# Patient Record
Sex: Female | Born: 1955 | Hispanic: Yes | Marital: Single | Smoking: Never smoker
Health system: Southern US, Community
[De-identification: ages and names within clinical notes are randomized; demographics above are authoritative.]

## PROBLEM LIST (undated history)

## (undated) DIAGNOSIS — E785 Hyperlipidemia, unspecified: Secondary | ICD-10-CM

## (undated) DIAGNOSIS — H8110 Benign paroxysmal vertigo, unspecified ear: Secondary | ICD-10-CM

## (undated) DIAGNOSIS — R7303 Prediabetes: Secondary | ICD-10-CM

## (undated) DIAGNOSIS — I10 Essential (primary) hypertension: Secondary | ICD-10-CM

## (undated) HISTORY — PX: HYSTEROTOMY: SHX1776

## (undated) HISTORY — DX: Prediabetes: R73.03

## (undated) HISTORY — DX: Benign paroxysmal vertigo, unspecified ear: H81.10

## (undated) HISTORY — DX: Hyperlipidemia, unspecified: E78.5

---

## 2021-07-14 ENCOUNTER — Encounter (HOSPITAL_COMMUNITY): Payer: Self-pay

## 2021-07-14 ENCOUNTER — Other Ambulatory Visit: Payer: Self-pay

## 2021-07-14 ENCOUNTER — Emergency Department (HOSPITAL_COMMUNITY)
Admission: EM | Admit: 2021-07-14 | Discharge: 2021-07-15 | Disposition: A | Discharge status: Home / Self Care | Payer: Self-pay | Attending: Emergency Medicine | Admitting: Emergency Medicine

## 2021-07-14 DIAGNOSIS — X58XXXA Exposure to other specified factors, initial encounter: Secondary | ICD-10-CM | POA: Insufficient documentation

## 2021-07-14 DIAGNOSIS — T6591XA Toxic effect of unspecified substance, accidental (unintentional), initial encounter: Secondary | ICD-10-CM

## 2021-07-14 DIAGNOSIS — I10 Essential (primary) hypertension: Secondary | ICD-10-CM | POA: Insufficient documentation

## 2021-07-14 DIAGNOSIS — T40711A Poisoning by cannabis, accidental (unintentional), initial encounter: Secondary | ICD-10-CM | POA: Insufficient documentation

## 2021-07-14 HISTORY — DX: Essential (primary) hypertension: I10

## 2021-07-14 NOTE — ED Notes (Signed)
Poison control recommends monitoring pt for 4 hrs minimum.

## 2021-07-14 NOTE — ED Provider Notes (Signed)
[facility]-EMERGENCY DEPT Provider Note   CSN: [id number] Arrival date & time: 07/14/21  2044     History Chief Complaint  Patient presents with   Ingestion    Alexis Sloan is a 65 y.o. female with a history of high cholesterol and low blood pressure presents to the emergency department after ingestion of 3 delta 8 Gummies at 35 mg each approximately 1 hour prior to arrival.  Patient's son brings her in stating that his mother, the patient and his daughter thought that the Gummies were candy.  Patient denies headache, nausea, lightheadedness, sleepiness, vomiting, diarrhea.  No specific aggravating or alleviating factors.  The history is provided by the patient and medical records. The history is limited by a language barrier. A language interpreter was used.      Past Medical History:  Diagnosis Date   Hypertension     There are no problems to display for this patient.   History reviewed. No pertinent surgical history.   OB History   No obstetric history on file.     History reviewed. No pertinent family history.     Home Medications Prior to Admission medications   Not on File    Allergies    Patient has no known allergies.  Review of Systems   Review of Systems  Constitutional:  Negative for appetite change, diaphoresis, fatigue, fever and unexpected weight change.  HENT:  Negative for mouth sores.   Eyes:  Negative for visual disturbance.  Respiratory:  Negative for cough, chest tightness, shortness of breath and wheezing.   Cardiovascular:  Negative for chest pain.  Gastrointestinal:  Negative for abdominal pain, constipation, diarrhea, nausea and vomiting.  Endocrine: Negative for polydipsia, polyphagia and polyuria.  Genitourinary:  Negative for dysuria, frequency, hematuria and urgency.  Musculoskeletal:  Negative for back pain and neck stiffness.  Skin:  Negative for rash.  Allergic/Immunologic: Negative for  immunocompromised state.  Neurological:  Negative for syncope, light-headedness and headaches.  Hematological:  Does not bruise/bleed easily.  Psychiatric/Behavioral:  Negative for sleep disturbance. The patient is not nervous/anxious.    Physical Exam Updated Vital Signs BP (!) 161/81 (BP Location: Right Arm)   Pulse 76   Temp 98.7 F (37.1 C) (Oral)   Resp 15   Ht 5' (1.524 m)   Wt 73.5 kg   SpO2 94%   BMI 31.64 kg/m   Physical Exam Vitals and nursing note reviewed.  Constitutional:      General: She is not in acute distress.    Appearance: She is not diaphoretic.  HENT:     Head: Normocephalic.  Eyes:     General: No scleral icterus.    Conjunctiva/sclera: Conjunctivae normal.  Cardiovascular:     Rate and Rhythm: Normal rate and regular rhythm.     Pulses: Normal pulses.          Radial pulses are 2+ on the right side and 2+ on the left side.  Pulmonary:     Effort: No tachypnea, accessory muscle usage, prolonged expiration, respiratory distress or retractions.     Breath sounds: No stridor.     Comments: Equal chest rise. No increased work of breathing. Abdominal:     General: There is no distension.     Palpations: Abdomen is soft.     Tenderness: There is no abdominal tenderness. There is no guarding or rebound.  Musculoskeletal:     Cervical back: Normal range of motion.     Comments: Moves  all extremities equally and without difficulty.  Skin:    General: Skin is warm and dry.     Capillary Refill: Capillary refill takes less than 2 seconds.  Neurological:     Mental Status: She is alert.     GCS: GCS eye subscore is 4. GCS verbal subscore is 5. GCS motor subscore is 6.     Comments: Speech is clear and goal oriented.  Psychiatric:        Mood and Affect: Mood normal.    ED Results / Procedures / Treatments     Procedures Procedures   Medications Ordered in ED Medications - No data to display  ED Course  I have reviewed the triage vital signs  and the nursing notes.  Pertinent labs & imaging results that were available during my care of the patient were reviewed by me and considered in my medical decision making (see chart for details).    MDM Rules/Calculators/A&P                          Patient presents with accidental overdose of delta 8 Gummies.  Triage RN discussed with poison control who recommends 4 hours of observation with vital sign monitoring.  Does not recommend blood work or telemetry at this time.  Vitals:   07/14/21 2101 07/15/21 0012  BP: (!) 161/81 123/72  Pulse: 76 67  Temp: 98.7 F (37.1 C)   Resp: 15 16  Height: 5' (1.524 m)   Weight: 73.5 kg   SpO2: 94% 98%  TempSrc: Oral   BMI (Calculated): 31.64      12:06 AM Patient reports she continues to feel well.  Does not wish to stay any longer for evaluation as she remains asymptomatic.  Vital signs within normal limits.  Patient will be discharged.  Discussed reasons to return immediately to the emergency department.   Final Clinical Impression(s) / ED Diagnoses Final diagnoses:  Accidental ingestion of substance, initial encounter    Rx / DC Orders ED Discharge Orders     None        Krishiv Sandler, Boyd Kerbs 07/15/21 0023    Margarita Grizzle, MD 07/16/21 [phone]

## 2021-07-14 NOTE — ED Triage Notes (Signed)
Pt reports taking 3 35 mg delta 9 gummies. Pt denies any symptoms at this time. Son is worried and brought her in to be evaluated.

## 2021-07-15 ENCOUNTER — Encounter (HOSPITAL_COMMUNITY): Payer: Self-pay | Admitting: Emergency Medicine

## 2021-07-15 ENCOUNTER — Inpatient Hospital Stay (HOSPITAL_COMMUNITY): Payer: Medicaid Other

## 2021-07-15 ENCOUNTER — Emergency Department (HOSPITAL_COMMUNITY): Payer: Medicaid Other

## 2021-07-15 ENCOUNTER — Inpatient Hospital Stay (HOSPITAL_COMMUNITY)
Admission: EM | Admit: 2021-07-15 | Discharge: 2021-07-18 | DRG: 917 | Disposition: A | Discharge status: Home / Self Care | Payer: Medicaid Other | Attending: Internal Medicine | Admitting: Internal Medicine

## 2021-07-15 DIAGNOSIS — D72829 Elevated white blood cell count, unspecified: Secondary | ICD-10-CM | POA: Diagnosis present

## 2021-07-15 DIAGNOSIS — R739 Hyperglycemia, unspecified: Secondary | ICD-10-CM | POA: Diagnosis present

## 2021-07-15 DIAGNOSIS — T50904A Poisoning by unspecified drugs, medicaments and biological substances, undetermined, initial encounter: Secondary | ICD-10-CM | POA: Diagnosis present

## 2021-07-15 DIAGNOSIS — T40711A Poisoning by cannabis, accidental (unintentional), initial encounter: Secondary | ICD-10-CM | POA: Diagnosis present

## 2021-07-15 DIAGNOSIS — I10 Essential (primary) hypertension: Secondary | ICD-10-CM | POA: Diagnosis present

## 2021-07-15 DIAGNOSIS — G929 Unspecified toxic encephalopathy: Secondary | ICD-10-CM | POA: Diagnosis present

## 2021-07-15 DIAGNOSIS — J9601 Acute respiratory failure with hypoxia: Secondary | ICD-10-CM | POA: Diagnosis present

## 2021-07-15 DIAGNOSIS — E872 Acidosis: Secondary | ICD-10-CM | POA: Diagnosis present

## 2021-07-15 DIAGNOSIS — R680 Hypothermia, not associated with low environmental temperature: Secondary | ICD-10-CM | POA: Diagnosis present

## 2021-07-15 DIAGNOSIS — R001 Bradycardia, unspecified: Secondary | ICD-10-CM | POA: Diagnosis present

## 2021-07-15 DIAGNOSIS — Z6832 Body mass index (BMI) 32.0-32.9, adult: Secondary | ICD-10-CM | POA: Diagnosis not present

## 2021-07-15 DIAGNOSIS — G928 Other toxic encephalopathy: Secondary | ICD-10-CM | POA: Diagnosis present

## 2021-07-15 DIAGNOSIS — T50901A Poisoning by unspecified drugs, medicaments and biological substances, accidental (unintentional), initial encounter: Secondary | ICD-10-CM

## 2021-07-15 DIAGNOSIS — Z20822 Contact with and (suspected) exposure to covid-19: Secondary | ICD-10-CM | POA: Diagnosis present

## 2021-07-15 DIAGNOSIS — Z978 Presence of other specified devices: Secondary | ICD-10-CM | POA: Diagnosis present

## 2021-07-15 DIAGNOSIS — T68XXXA Hypothermia, initial encounter: Secondary | ICD-10-CM

## 2021-07-15 LAB — COMPREHENSIVE METABOLIC PANEL
ALT: 29 U/L (ref 0–44)
AST: 24 U/L (ref 15–41)
Albumin: 4.2 g/dL (ref 3.5–5.0)
Alkaline Phosphatase: 54 U/L (ref 38–126)
Anion gap: 13 (ref 5–15)
BUN: 16 mg/dL (ref 8–23)
CO2: 19 mmol/L — ABNORMAL LOW (ref 22–32)
Calcium: 9.1 mg/dL (ref 8.9–10.3)
Chloride: 106 mmol/L (ref 98–111)
Creatinine, Ser: 0.7 mg/dL (ref 0.44–1.00)
GFR, Estimated: >60 mL/min (ref >60)
Glucose, Bld: 196 mg/dL — ABNORMAL HIGH (ref 70–99)
Potassium: 3.9 mmol/L (ref 3.5–5.1)
Sodium: 138 mmol/L (ref 135–145)
Total Bilirubin: 0.5 mg/dL (ref 0.3–1.2)
Total Protein: 7.7 g/dL (ref 6.5–8.1)

## 2021-07-15 LAB — URINALYSIS, ROUTINE W REFLEX MICROSCOPIC
Bilirubin Urine: NEGATIVE
Glucose, UA: NEGATIVE mg/dL
Hgb urine dipstick: NEGATIVE
Ketones, ur: NEGATIVE mg/dL
Leukocytes,Ua: NEGATIVE
Nitrite: NEGATIVE
Protein, ur: NEGATIVE mg/dL
Specific Gravity, Urine: 1.023 (ref 1.005–1.030)
pH: 5 (ref 5.0–8.0)

## 2021-07-15 LAB — BLOOD GAS, ARTERIAL
Acid-base deficit: 3.8 mmol/L — ABNORMAL HIGH (ref 0.0–2.0)
Bicarbonate: 20.8 mmol/L (ref 20.0–28.0)
FIO2: 100
MECHVT: 370 mL
O2 Saturation: 100 %
PEEP: 5 cmH2O
Patient temperature: 95.9
RATE: 16 resp/min
pCO2 arterial: 35.8 mmHg (ref 32.0–48.0)
pH, Arterial: 7.374 (ref 7.350–7.450)
pO2, Arterial: 375 mmHg — ABNORMAL HIGH (ref 83.0–108.0)

## 2021-07-15 LAB — LACTIC ACID, PLASMA
Lactic Acid, Venous: 2.6 mmol/L (ref 0.5–1.9)
Lactic Acid, Venous: 2.7 mmol/L (ref 0.5–1.9)

## 2021-07-15 LAB — GLUCOSE, CAPILLARY
Glucose-Capillary: 116 mg/dL — ABNORMAL HIGH (ref 70–99)
Glucose-Capillary: 107 mg/dL — ABNORMAL HIGH (ref 70–99)
Glucose-Capillary: 111 mg/dL — ABNORMAL HIGH (ref 70–99)

## 2021-07-15 LAB — TSH: TSH: 0.819 u[IU]/mL (ref 0.350–4.500)

## 2021-07-15 LAB — CBC WITH DIFFERENTIAL/PLATELET
Abs Immature Granulocytes: 0.05 10*3/uL (ref 0.00–0.07)
Basophils Absolute: 0 10*3/uL (ref 0.0–0.1)
Basophils Relative: 0 %
Eosinophils Absolute: 0.3 10*3/uL (ref 0.0–0.5)
Eosinophils Relative: 2 %
HCT: 41.9 % (ref 36.0–46.0)
Hemoglobin: 13.8 g/dL (ref 12.0–15.0)
Immature Granulocytes: 0 %
Lymphocytes Relative: 34 %
Lymphs Abs: 4.1 10*3/uL — ABNORMAL HIGH (ref 0.7–4.0)
MCH: 30.7 pg (ref 26.0–34.0)
MCHC: 32.9 g/dL (ref 30.0–36.0)
MCV: 93.3 fL (ref 80.0–100.0)
Monocytes Absolute: 0.6 10*3/uL (ref 0.1–1.0)
Monocytes Relative: 5 %
Neutro Abs: 7.1 10*3/uL (ref 1.7–7.7)
Neutrophils Relative %: 59 %
Platelets: 241 10*3/uL (ref 150–400)
RBC: 4.49 MIL/uL (ref 3.87–5.11)
RDW: 12.9 % (ref 11.5–15.5)
WBC: 12.1 10*3/uL — ABNORMAL HIGH (ref 4.0–10.5)
nRBC: 0 % (ref 0.0–0.2)

## 2021-07-15 LAB — SALICYLATE LEVEL: Salicylate Lvl: <7 mg/dL — ABNORMAL LOW (ref 7.0–30.0)

## 2021-07-15 LAB — MRSA NEXT GEN BY PCR, NASAL: MRSA by PCR Next Gen: NOT DETECTED

## 2021-07-15 LAB — SARS CORONAVIRUS 2 (TAT 6-24 HRS): SARS Coronavirus 2: NEGATIVE

## 2021-07-15 LAB — TROPONIN I (HIGH SENSITIVITY)
Troponin I (High Sensitivity): <2 ng/L (ref <18)
Troponin I (High Sensitivity): <2 ng/L (ref <18)

## 2021-07-15 LAB — RAPID URINE DRUG SCREEN, HOSP PERFORMED
Amphetamines: NOT DETECTED
Barbiturates: NOT DETECTED
Benzodiazepines: NOT DETECTED
Cocaine: NOT DETECTED
Opiates: NOT DETECTED
Tetrahydrocannabinol: POSITIVE — AB

## 2021-07-15 LAB — CK: Total CK: 115 U/L (ref 38–234)

## 2021-07-15 LAB — ACETAMINOPHEN LEVEL: Acetaminophen (Tylenol), Serum: <10 ug/mL — ABNORMAL LOW (ref 10–30)

## 2021-07-15 LAB — HIV ANTIBODY (ROUTINE TESTING W REFLEX): HIV Screen 4th Generation wRfx: NONREACTIVE

## 2021-07-15 LAB — ETHANOL: Alcohol, Ethyl (B): <10 mg/dL (ref <10)

## 2021-07-15 LAB — AMMONIA: Ammonia: 26 umol/L (ref 9–35)

## 2021-07-15 MED ORDER — SODIUM CHLORIDE 0.9 % IV SOLN
INTRAVENOUS | Status: DC | PRN
  Administered 2021-07-15: 250 mL via INTRAVENOUS

## 2021-07-15 MED ORDER — PROPOFOL 1000 MG/100ML IV EMUL
0.0000 ug/kg/min | INTRAVENOUS | Status: DC
Start: 2021-07-15 — End: 2021-07-15
  Filled 2021-07-15: qty 100

## 2021-07-15 MED ORDER — FENTANYL CITRATE (PF) 100 MCG/2ML IJ SOLN
50.0000 ug | INTRAMUSCULAR | Status: DC | PRN
  Filled 2021-07-15: qty 2

## 2021-07-15 MED ORDER — MIDAZOLAM HCL 2 MG/2ML IJ SOLN
INTRAMUSCULAR | Status: AC
  Filled 2021-07-15: qty 6

## 2021-07-15 MED ORDER — DEXMEDETOMIDINE HCL IN NACL 200 MCG/50ML IV SOLN
0.4000 ug/kg/h | INTRAVENOUS | Status: DC
  Filled 2021-07-15: qty 50

## 2021-07-15 MED ORDER — FENTANYL CITRATE (PF) 100 MCG/2ML IJ SOLN: 25.0000 ug | INTRAMUSCULAR | Status: DC | PRN

## 2021-07-15 MED ORDER — FAMOTIDINE 20 MG PO TABS
20.0000 mg | ORAL_TABLET | Freq: Two times a day (BID) | ORAL | Status: DC
  Administered 2021-07-16: 20 mg via ORAL
  Filled 2021-07-15: qty 1

## 2021-07-15 MED ORDER — MIDAZOLAM 50MG/50ML (1MG/ML) PREMIX INFUSION
0.5000 mg/h | INTRAVENOUS | Status: DC
  Administered 2021-07-15: 0.5 mg/h via INTRAVENOUS
  Filled 2021-07-15: qty 50

## 2021-07-15 MED ORDER — SUCCINYLCHOLINE CHLORIDE 20 MG/ML IJ SOLN
INTRAMUSCULAR | Status: AC | PRN
  Administered 2021-07-15: 80 mg via INTRAVENOUS

## 2021-07-15 MED ORDER — POTASSIUM CHLORIDE IN NACL 20-0.9 MEQ/L-% IV SOLN
INTRAVENOUS | Status: DC
  Filled 2021-07-15 (×2): qty 1000

## 2021-07-15 MED ORDER — MIDAZOLAM HCL 5 MG/5ML IJ SOLN
INTRAMUSCULAR | Status: AC | PRN
  Administered 2021-07-15: 5 mg via INTRAVENOUS

## 2021-07-15 MED ORDER — FENTANYL CITRATE (PF) 100 MCG/2ML IJ SOLN
INTRAMUSCULAR | Status: AC | PRN
  Administered 2021-07-15: 50 ug via INTRAVENOUS

## 2021-07-15 MED ORDER — PROPOFOL 1000 MG/100ML IV EMUL: 0.0000 ug/kg/min | INTRAVENOUS | Status: DC

## 2021-07-15 MED ORDER — POLYETHYLENE GLYCOL 3350 17 G PO PACK
17.0000 g | PACK | Freq: Every day | ORAL | Status: DC
  Administered 2021-07-15: 17 g
  Filled 2021-07-15: qty 1

## 2021-07-15 MED ORDER — ALBUTEROL SULFATE (2.5 MG/3ML) 0.083% IN NEBU: 2.5000 mg | INHALATION_SOLUTION | RESPIRATORY_TRACT | Status: DC | PRN

## 2021-07-15 MED ORDER — NALOXONE HCL 2 MG/2ML IJ SOSY
PREFILLED_SYRINGE | INTRAMUSCULAR | Status: AC
  Administered 2021-07-15: 2 mg
  Filled 2021-07-15: qty 2

## 2021-07-15 MED ORDER — ETOMIDATE 2 MG/ML IV SOLN
INTRAVENOUS | Status: AC | PRN
  Administered 2021-07-15: 20 mg via INTRAVENOUS

## 2021-07-15 MED ORDER — PROPOFOL 500 MG/50ML IV EMUL
INTRAVENOUS | Status: AC
  Filled 2021-07-15: qty 50

## 2021-07-15 MED ORDER — HEPARIN SODIUM (PORCINE) 5000 UNIT/ML IJ SOLN
5000.0000 [IU] | Freq: Three times a day (TID) | INTRAMUSCULAR | Status: DC
  Administered 2021-07-15 – 2021-07-18 (×9): 5000 [IU] via SUBCUTANEOUS
  Filled 2021-07-15 (×9): qty 1

## 2021-07-15 MED ORDER — DOCUSATE SODIUM 50 MG/5ML PO LIQD
100.0000 mg | Freq: Two times a day (BID) | ORAL | Status: DC
  Administered 2021-07-15: 100 mg
  Filled 2021-07-15: qty 10

## 2021-07-15 MED ORDER — ETOMIDATE 2 MG/ML IV SOLN
INTRAVENOUS | Status: AC
  Filled 2021-07-15: qty 20

## 2021-07-15 MED ORDER — MIDAZOLAM HCL 2 MG/2ML IJ SOLN
5.0000 mg | Freq: Once | INTRAMUSCULAR | Status: DC
  Filled 2021-07-15: qty 6

## 2021-07-15 MED ORDER — ACETAMINOPHEN 650 MG RE SUPP: 650.0000 mg | RECTAL | Status: DC | PRN

## 2021-07-15 MED ORDER — PROPOFOL 1000 MG/100ML IV EMUL
INTRAVENOUS | Status: AC | PRN
  Administered 2021-07-15: 5 ug/kg/min via INTRAVENOUS

## 2021-07-15 MED ORDER — FAMOTIDINE IN NACL 20-0.9 MG/50ML-% IV SOLN
20.0000 mg | Freq: Two times a day (BID) | INTRAVENOUS | Status: DC
  Administered 2021-07-15 (×2): 20 mg via INTRAVENOUS
  Filled 2021-07-15 (×2): qty 50

## 2021-07-15 MED ORDER — FENTANYL CITRATE (PF) 100 MCG/2ML IJ SOLN
50.0000 ug | INTRAMUSCULAR | Status: DC | PRN
Start: 2021-07-15 — End: 2021-07-15
  Filled 2021-07-15: qty 2

## 2021-07-15 MED ORDER — PROPOFOL 1000 MG/100ML IV EMUL
INTRAVENOUS | Status: AC
  Filled 2021-07-15: qty 100

## 2021-07-15 MED ORDER — ORAL CARE MOUTH RINSE
15.0000 mL | OROMUCOSAL | Status: DC
  Administered 2021-07-15: 15 mL via OROMUCOSAL

## 2021-07-15 MED ORDER — POLYETHYLENE GLYCOL 3350 17 G PO PACK: 17.0000 g | PACK | Freq: Every day | ORAL | Status: DC | PRN

## 2021-07-15 MED ORDER — ACETAMINOPHEN 325 MG PO TABS
650.0000 mg | ORAL_TABLET | ORAL | Status: DC | PRN
  Administered 2021-07-16 – 2021-07-17 (×2): 650 mg via ORAL
  Filled 2021-07-15 (×2): qty 2

## 2021-07-15 MED ORDER — SODIUM CHLORIDE 0.9 % IV BOLUS
1000.0000 mL | Freq: Once | INTRAVENOUS | Status: AC
  Administered 2021-07-15: 1000 mL via INTRAVENOUS

## 2021-07-15 MED ORDER — INSULIN ASPART 100 UNIT/ML IJ SOLN: 0.0000 [IU] | INTRAMUSCULAR | Status: DC

## 2021-07-15 MED ORDER — CHLORHEXIDINE GLUCONATE 0.12% ORAL RINSE (MEDLINE KIT)
15.0000 mL | Freq: Two times a day (BID) | OROMUCOSAL | Status: DC
  Administered 2021-07-15: 15 mL via OROMUCOSAL

## 2021-07-15 MED ORDER — DOCUSATE SODIUM 100 MG PO CAPS: 100.0000 mg | ORAL_CAPSULE | Freq: Two times a day (BID) | ORAL | Status: DC | PRN

## 2021-07-15 MED ORDER — SUCCINYLCHOLINE CHLORIDE 200 MG/10ML IV SOSY
PREFILLED_SYRINGE | INTRAVENOUS | Status: AC
  Filled 2021-07-15: qty 10

## 2021-07-15 MED ORDER — ORAL CARE MOUTH RINSE
15.0000 mL | Freq: Two times a day (BID) | OROMUCOSAL | Status: DC
  Administered 2021-07-15: 15 mL via OROMUCOSAL

## 2021-07-15 MED ORDER — CHLORHEXIDINE GLUCONATE CLOTH 2 % EX PADS
6.0000 | MEDICATED_PAD | Freq: Every day | CUTANEOUS | Status: DC
Start: 2021-07-15 — End: 2021-07-17
  Administered 2021-07-15 – 2021-07-16 (×2): 6 via TOPICAL

## 2021-07-15 NOTE — Procedures (Signed)
Extubation Procedure Note  Patient Details:   Name: Alexis Sloan DOB: 1956-05-15 MRN: [id number]   Airway Documentation:  Airway 7 mm (Active)  Secured at (cm) 23 cm 07/15/21 1238  Measured From Lips 07/15/21 1238  Secured Location Center 07/15/21 0430  Secured By [employer] 07/15/21 1048  Tube Holder Repositioned Yes 07/15/21 1048  Prone position No 07/15/21 1048  Cuff Pressure (cm H2O) MOV (Manual Technique) 07/15/21 0743  Site Condition Dry 07/15/21 0730   Vent end date: (not recorded) Vent end time: (not recorded)   Evaluation  O2 sats: stable throughout Complications: No apparent complications Patient did tolerate procedure well. Bilateral Breath Sounds: Clear, Diminished   Yes  Dairl Ponder Nannette 07/15/2021, 3:37 PM  Positive cuff leak pre extubation. Placed on 2 LPM nasal cannula post extubation- Sp02 100%, no distress at this time. RN aware PT has been extubated.

## 2021-07-15 NOTE — Plan of Care (Signed)
Discussed in front of patient plan of care, admission needs and sedation management with no evidence of learning.  Patient is intubated and on a ventilator.  Will have to verify admission question with family.  Problem: Safety: Goal: Non-violent Restraint(s) Outcome: Progressing

## 2021-07-15 NOTE — H&P (Signed)
NAME:  Alexis Sloan MRN:  [id number] DOB:  03-30-1956 LOS: 0 ADMISSION DATE:  07/15/2021 DATE OF SERVICE:  07/15/2021  CHIEF COMPLAINT:  altered mental status   HISTORY & PHYSICAL  History of Present Illness  This 65 y.o. Latina female presented to the [facility] Emergency Department via EMS with complaints of altered mental status.  She had just been discharged from the same facility approximately one hour earlier after observation for self-reported overdose on THC-containing gummies.  She reported that she ate the gummies without knowing they contained THC.  She reportedly walked out of the ER on her own; however, EMS was soon after called to her house, where she was found unresponsive.  She was transported to the ER, where she was intubated for airway protection.  She is on mechanical ventilatory support and sedated at the time of this clinical encounter.  CT of head showed no acute process.  REVIEW OF SYSTEMS This patient is critically ill and cannot provide additional history nor review of systems due to mental status/unconsciousness, endotracheally intubated, and language barrier (Spanish) .   Past Medical/Surgical/Social/Family History   Past Medical History:  Diagnosis Date   Hypertension     History reviewed. No pertinent surgical history.  Social History   Tobacco Use   Smoking status: Never    Passive exposure: Never   Smokeless tobacco: Never  Substance Use Topics   Alcohol use: Never    History reviewed. No pertinent family history.   Procedures:  7/18 intubated in ER   Significant Diagnostic Tests:  UDS positive for [facility]   Micro Data:  No results found for this or any previous visit.    Antimicrobials:      Interim history/subjective:     Objective   BP 119/73   Pulse (!) 52   Temp (!) 95.8 F (35.4 C)   Resp 16   Ht 5' (1.524 m)   Wt 73.8 kg   SpO2 100%   BMI 31.79 kg/m     Filed Weights   07/15/21  0430  Weight: 73.8 kg   No intake or output data in the 24 hours ending 07/15/21 0613  Vent Mode: PRVC FiO2 (%):  [100 %] 100 % Set Rate:  [16 bmp] 16 bmp Vt Set:  [370 mL] 370 mL PEEP:  [5 cmH20] 5 cmH20 Plateau Pressure:  [17 cmH20] 17 cmH20   Examination: GENERAL: Intubated. Seddated. Well-developed. No acute distress. HEAD: normocephalic, atraumatic EYE: PERRLA, EOM intact, no scleral icterus, no pallor. THROAT/ORAL CAVITY: Normal dentition. No exudate. Mucous membranes are moist.  NECK: supple, no thyromegaly, no JVD, no lymphadenopathy. Trachea midline. CHEST/LUNG: symmetric in development and expansion. Good air entry. No crackles. No wheezes. HEART: Regular S1 and S2 without murmur, rub or gallop. ABDOMEN: soft, nontender, nondistended. Normoactive bowel sounds. No rebound. No guarding. No hepatosplenomegaly. EXTREMITIES: Edema: none. No cyanosis. No clubbing. 2+ DP pulses LYMPHATIC: no cervical/axillary/inguinal lymph nodes appreciated MUSCULOSKELETAL: No point tenderness. No bulk atrophy. Joints: normal inspection.  SKIN:  No rash or lesion. NEUROLOGIC: No response to noxious stimuli.  Synchronous with ventilator.  Corneal reflex intact.   Resolved Hospital Problem list      Assessment & Plan:   ASSESSMENT/PLAN:  ASSESSMENT (included in the Hospital Problem List)  Principal Problem:   Overdose, drug, undetermined intent, initial encounter Active Problems:   Toxic encephalopathy   Leukocytosis   Endotracheally intubated   By systems: PULMONARY Endotracheally intubated Bibasilar atelectasis on CXR  Trach aspirate for Gram stain, C/S Titrate sedation for RASS 0 to -1 Anticipate liberation from mechanical ventilatory support within 24 hours   CARDIOVASCULAR: No acute issues Hypertension at baseline Hemodynamic monitoring per ICU protocol   RENAL: No acute issues Foley catheter placement (24 hour critically ill patient)   GASTROINTESTINAL: No acute  issues GI PROPHYLAXIS: famotidine   HEMATOLOGIC Leukocytosis, unknown etiology Trach aspirate No strong indication for antibiotics at this time DVT PROPHYLAXIS: heparin   INFECTIOUS: No acute issues Asymptomatic COVID-19 screening   ENDOCRINE: No acute issues   NEUROLOGIC Encephalopathy, toxic, likely secondary to substance abuse Titrate sedation for RASS 0 to -1   PLAN/RECOMMENDATIONS  Admit to ICU under my service (Attending: Marcelle Smiling, MD) with the diagnoses highlighted above in the active Hospital Problem List (ASSESSMENT). See above.    My assessment, plan of care, findings, medications, side effects, etc. were discussed with: nurse.   Best practice:  Diet: TF Pain/Anxiety/Delirium protocol (if indicated): propofol VAP protocol (if indicated): YES DVT prophylaxis: heparin GI prophylaxis: famotidine Glucose control: N/A Mobility/Activity: bedrest   Code Status: Full Code Family Communication:   no family at bedside Disposition: admit to ICU   Labs   CBC: Recent Labs  Lab 07/15/21 0418  WBC 12.1*  NEUTROABS 7.1  HGB 13.8  HCT 41.9  MCV 93.3  PLT 241    Basic Metabolic Panel: Recent Labs  Lab 07/15/21 0418  NA 138  K 3.9  CL 106  CO2 19*  GLUCOSE 196*  BUN 16  CREATININE 0.70  CALCIUM 9.1   GFR: Estimated Creatinine Clearance: 62.9 mL/min (by C-G formula based on SCr of 0.7 mg/dL). Recent Labs  Lab 07/15/21 0418  WBC 12.1*    Liver Function Tests: Recent Labs  Lab 07/15/21 0418  AST 24  ALT 29  ALKPHOS 54  BILITOT 0.5  PROT 7.7  ALBUMIN 4.2   No results for input(s): LIPASE, AMYLASE in the last 168 hours. No results for input(s): AMMONIA in the last 168 hours.  ABG    Component Value Date/Time   PHART 7.374 07/15/2021 0535   PCO2ART 35.8 07/15/2021 0535   PO2ART 375 (H) 07/15/2021 0535   HCO3 20.8 07/15/2021 0535   ACIDBASEDEF 3.8 (H) 07/15/2021 0535   O2SAT 100.0 07/15/2021 0535     Coagulation Profile: No  results for input(s): INR, PROTIME in the last 168 hours.  Cardiac Enzymes: Recent Labs  Lab 07/15/21 0418  CKTOTAL 115    HbA1C: No results found for: HGBA1C  CBG: No results for input(s): GLUCAP in the last 168 hours.   Past Medical History   Past Medical History:  Diagnosis Date   Hypertension       Surgical History   History reviewed. No pertinent surgical history.    Social History   Social History   Socioeconomic History   Marital status: Single    Spouse name: Not on file   Number of children: Not on file   Years of education: Not on file   Highest education level: Not on file  Occupational History   Not on file  Tobacco Use   Smoking status: Never    Passive exposure: Never   Smokeless tobacco: Never  Substance and Sexual Activity   Alcohol use: Never   Drug use: Never   Sexual activity: Not on file  Other Topics Concern   Not on file  Social History Narrative   Not on file   Social Determinants of Health  Financial Resource Strain: Not on file  Food Insecurity: Not on file  Transportation Needs: Not on file  Physical Activity: Not on file  Stress: Not on file  Social Connections: Not on file      Family History   History reviewed. No pertinent family history. family history is not on file.    Allergies No Known Allergies    Current Medications  Current Facility-Administered Medications:    fentaNYL (SUBLIMAZE) injection 50 mcg, 50 mcg, Intravenous, Q15 min PRN, Rancour, Stephen, MD   fentaNYL (SUBLIMAZE) injection 50 mcg, 50 mcg, Intravenous, Q2H PRN, Rancour, Stephen, MD   midazolam (VERSED) 50 mg/50 mL (1 mg/mL) premix infusion, 0.5-10 mg/hr, Intravenous, Continuous, Rancour, Stephen, MD, Last Rate: 0.5 mL/hr at 07/15/21 0538, 0.5 mg/hr at 07/15/21 0538   midazolam (VERSED) injection 5 mg, 5 mg, Intravenous, Once, Rancour, Stephen, MD   propofol (DIPRIVAN) 1000 MG/100ML infusion, 0-50 mcg/kg/min, Intravenous, Continuous,  Rancour, Stephen, MD No current outpatient medications on file.   Home Medications  Prior to Admission medications   Not on File      Critical care time: 30 minutes.  The treatment and management of the patient's condition was required based on the threat of imminent deterioration. This time reflects time spent by the physician evaluating, providing care and managing the critically ill patient's care. The time was spent at the immediate bedside (or on the same floor/unit and dedicated to this patient's care). Time involved in separately billable procedures is NOT included int he critical care time indicated above. Family meeting and update time may be included above if and only if the patient is unable/incompetent to participate in clinical interview and/or decision making, and the discussion was necessary to determining treatment decisions.   Marcelle Smiling, MD Board Certified by the ABIM, Pulmonary Diseases & Critical Care Medicine

## 2021-07-15 NOTE — Progress Notes (Deleted)
Nutrition Brief Note  Consult received for TF initiation and management.   Wt Readings from Last 15 Encounters:  07/15/21 73.8 kg  07/14/21 73.5 kg    Body mass index is 31.79 kg/m. Patient meets criteria for obesity based on current BMI. PTA the only other weight recorded in the chart was 159 lb at [facility] the [place] on 02/13/20. Weight today is 162 lb. Skin WDL.  Current diet order is NPO. Labs and medications reviewed.   Patient admitted for drug OD and was intubated in the ED for airway protection. OGT placed. Patient weaned off sedation and was following commands earlier this AM. She is now noted to be on room air. OGT removed at time of extubation.  No nutrition interventions warranted at this time. If nutrition issues arise, please re-consult RD.      Trenton Gammon, MS, RD, LDN, CNSC Inpatient Clinical Dietitian RD pager # available in AMION  After hours/weekend pager # available in [facility]

## 2021-07-15 NOTE — Progress Notes (Signed)
eLink Physician-Brief Progress Note Patient Name: Alexis Sloan DOB: 03-02-1956 MRN: [id number]   Date of Service  07/15/2021  HPI/Events of Note  Hyperglycemia - Blood glucose = 116 and 107 while NPO. Patient passed beside swallow evaluation per nursing - Request to advance diet as tolerated.   eICU Interventions  Plan: Clear liquid diet as tolerated.  Q 4 hour sensitive Novolog SSI.     Intervention Category Major Interventions: Other:;Hyperglycemia - active titration of insulin therapy  Lenell Antu 07/15/2021, 8:11 PM

## 2021-07-15 NOTE — Progress Notes (Signed)
NAME:  Alexis Sloan, MRN:  [id number], DOB:  1956-05-24, LOS: 0 ADMISSION DATE:  07/15/2021 CONSULTATION DATE:  07/15/2021 REFERRING MD:  [facility] - EDP CHIEF COMPLAINT:  AMS   History of Present Illness:  65 year old female with PMHx significant for HTN who presented to [facility] ED 7/18 via EMS for altered mental status. Patient had been discharged from [facility] ED approximately one hour prior to re-presentation. Initially presented after mistakenly consuming three THC gummies (35mg  Delta 8), Poison Control was contacted and advised observation  x 4 hours. Patient remained in ED and was discharged home near her baseline. About an hour after discharge, patient was noted to be pale and diaphoretic and had a syncopal episode (without head injury). She nearly vomited, became altered and began gurgling. EMS was called and brought patient to [facility] ED where she was intubated for airway protection.  On arrival to ED, patient was bradycardic and hypothermic. Labs were overall reassuring, UDS positive for THC as expected by otherwise negative. CT Head was completed without evidence of acute intracranial abnormality.  PCCM contacted for admission.  Pertinent Medical History:   Past Medical History:  Diagnosis Date   Hypertension    Significant Hospital Events: Including procedures, antibiotic start and stop dates in addition to other pertinent events   7/17 Presented to [facility] ED after accidental consumption of three 35mg  Delta 8 THC gummies. Poison Control contacted. Observed x 4 hours in ED and d/c home at baseline 7/18 Returned to [facility] ED via EMS after becoming altered at home. Unable to protect airway and intubated. PCCM admitted. Transferred to ICU for monitoring. Bradycardic, sedation weaned, following commands appropriately  Interim History / Subjective:  Admitted early AM Responds to noxious stimuli (suctioning mouth, squeezing legs) Not following commands at time of exam Remains on Prop 15mg /hr,  sedated Brady to 40s Minimal vent requirements (PEEP 5, FiO2 30%)  Objective   Blood pressure (!) 112/52, pulse (!) 50, temperature 97.7 F (36.5 C), resp. rate 12, height 5' (1.524 m), weight 73.8 kg, SpO2 100 %.    Vent Mode: PRVC FiO2 (%):  [30 %-100 %] 30 % Set Rate:  [12 bmp-16 bmp] 12 bmp Vt Set:  [370 mL] 370 mL PEEP:  [5 cmH20] 5 cmH20 Plateau Pressure:  [14 cmH20-17 cmH20] 14 cmH20  No intake or output data in the 24 hours ending 07/15/21 1014 Filed Weights   07/15/21 0430  Weight: 73.8 kg   Physical Examination: General: Acutely ill-appearing middle-aged woman in NAD. HEENT: [place]/AT, anicteric sclera, pupils equal 93mm and sluggishly reactive to light, moist mucous membranes.  Neuro: Sedated. Responds to noxious stimuli. Not following commands. Moves all 4 extremities spontaneously. +Cough and +Gag  CV: RRR, no m/g/r. PULM: Breathing even and unlabored on vent (PEEP 5, FiO2 30%). Lung fields CTAB. GI: Soft, nontender, nondistended. Normoactive bowel sounds. Extremities: No LE edema noted. Skin: Warm/dry, no rashes.  Resolved Hospital Problem List:    Assessment & Plan:  Acute toxic encephalopathy secondary to THC use BIB EMS after accidental consumption of three 35mg  Delta 8 THC gummies. Poison Control contacted and recommended obs x 4 hours. Discharged home but returned for AMS and inability to protect airway. - UDS positive for THC as expected, otherwise negative - Ethanol and additional substance detection negative - CT Head NAICA - Intubated for airway protection (as below) - Mental status improving, following commands off sedation  Acute hypoxic respiratory failure secondary to altered mental status Bibasilar atelectasis Intubated in the ED  in the setting of AMS and inability to protect airway. - Plan for extubation today, 7/18 given improved mental status and ability to follow commands - Wean FiO2 for O2 sat > 90% - Pulmonary hygiene - PAD protocol for  sedation: Precedex for goal RASS 0 to -1 - Propofol discontinued in the setting of bradycardia  Acute leukocytosis WBC 12.1 on admission. CXR unremarkable. - Trend WBC, fever curve (normothermic at present) - F/u finalized BCx  Bradycardia Likely in the setting of sedation (Propofol) vs. Substance use - Cardiac monitoring - Limiting sedation as able  Hypertension History of hypertension. - Continue to monitor  Best Practice (right click and "Reselect all SmartList Selections" daily)   Diet/type: NPO DVT prophylaxis: prophylactic heparin  GI prophylaxis: H2B Lines: N/A Foley:  Yes, and it is no longer needed Code Status:  full code Last date of multidisciplinary goals of care discussion [Pending]  Critical care time: 35 minutes   Tim Lair, PA-C [facility] 07/15/21 10:58 AM  Please see [email or web address] for pager details.  From 7A-7P if no response, please call [phone] After hours, please call ELink [phone]

## 2021-07-15 NOTE — ED Notes (Signed)
ED TO INPATIENT HANDOFF REPORT  Name/Age/Gender Alexis Sloan 65 y.o. female  Code Status    Code Status Orders  (From admission, onward)         Start     Ordered   07/15/21 0629  Full code  Continuous        07/15/21 1791        Code Status History    This patient has a current code status but no historical code status.      Home/SNF/Other Home  Chief Complaint Toxic encephalopathy [G92.9]  Level of Care/Admitting Diagnosis ED Disposition    ED Disposition  Admit   Condition  --   Comment  Hospital Area: [facility] [[id number]]  Level of Care: ICU [6]  May admit patient to [facility] or [facility] if equivalent level of care is available:: No  Covid Evaluation: Asymptomatic Screening Protocol (No Symptoms)  Diagnosis: Toxic encephalopathy [349.82.ICD-9-CM]  Admitting Physician: Marcelle Smiling [[id number]]  Attending Physician: Marcelle Smiling [[id number]]  Estimated length of stay: 3 - 4 days  Certification:: I certify this patient will need inpatient services for at least 2 midnights         Medical History Past Medical History:  Diagnosis Date  . Hypertension     Allergies No Known Allergies  IV Location/Drains/Wounds Patient Lines/Drains/Airways Status    Active Line/Drains/Airways    Name Placement date Placement time Site Days   Peripheral IV 07/15/21 20 G Anterior;Proximal;Right Forearm 07/15/21  0415  Forearm  less than 1   Peripheral IV 07/15/21 20 G Left Hand 07/15/21  0421  Hand  less than 1   NG/OG Vented/Dual Lumen Orogastric 16 Fr. Oral 07/15/21  0439  Oral  less than 1   Urethral Catheter Josh, EMT Temperature probe;Double-lumen 16 Fr. 07/15/21  0441  Temperature probe;Double-lumen  less than 1   Airway 7 mm 07/15/21  0429  -- less than 1          Labs/Imaging Results for orders placed or performed during the hospital encounter of 07/15/21 (from the past 48 hour(s))  CBC with  Differential/Platelet     Status: Abnormal   Collection Time: 07/15/21  4:18 AM  Result Value Ref Range   WBC 12.1 (H) 4.0 - 10.5 K/uL   RBC 4.49 3.87 - 5.11 MIL/uL   Hemoglobin 13.8 12.0 - 15.0 g/dL   HCT 37.4 82.7 - 07.8 %   MCV 93.3 80.0 - 100.0 fL   MCH 30.7 26.0 - 34.0 pg   MCHC 32.9 30.0 - 36.0 g/dL   RDW 67.5 44.9 - 20.1 %   Platelets 241 150 - 400 K/uL   nRBC 0.0 0.0 - 0.2 %   Neutrophils Relative % 59 %   Neutro Abs 7.1 1.7 - 7.7 K/uL   Lymphocytes Relative 34 %   Lymphs Abs 4.1 (H) 0.7 - 4.0 K/uL   Monocytes Relative 5 %   Monocytes Absolute 0.6 0.1 - 1.0 K/uL   Eosinophils Relative 2 %   Eosinophils Absolute 0.3 0.0 - 0.5 K/uL   Basophils Relative 0 %   Basophils Absolute 0.0 0.0 - 0.1 K/uL   Immature Granulocytes 0 %   Abs Immature Granulocytes 0.05 0.00 - 0.07 K/uL    Comment: Performed at [facility], [address] [place], [place] [address]  Comprehensive metabolic panel     Status: Abnormal   Collection Time: 07/15/21  4:18 AM  Result Value Ref Range  Sodium 138 135 - 145 mmol/L   Potassium 3.9 3.5 - 5.1 mmol/L   Chloride 106 98 - 111 mmol/L   CO2 19 (L) 22 - 32 mmol/L   Glucose, Bld 196 (H) 70 - 99 mg/dL    Comment: Glucose reference range applies only to samples taken after fasting for at least 8 hours.   BUN 16 8 - 23 mg/dL   Creatinine, Ser 2.35 0.44 - 1.00 mg/dL   Calcium 9.1 8.9 - 57.3 mg/dL   Total Protein 7.7 6.5 - 8.1 g/dL   Albumin 4.2 3.5 - 5.0 g/dL   AST 24 15 - 41 U/L   ALT 29 0 - 44 U/L   Alkaline Phosphatase 54 38 - 126 U/L   Total Bilirubin 0.5 0.3 - 1.2 mg/dL   GFR, Estimated >22 >02 mL/min    Comment: (NOTE) Calculated using the CKD-EPI Creatinine Equation (2021)    Anion gap 13 5 - 15    Comment: Performed at [facility], [address], [place], [place] [address]  CK     Status: None   Collection Time: 07/15/21  4:18 AM  Result Value Ref Range   Total CK 115 38 - 234 U/L    Comment:  Performed at [facility], [address], [place], [place] [address]  Troponin I (High Sensitivity)     Status: None   Collection Time: 07/15/21  4:18 AM  Result Value Ref Range   Troponin I (High Sensitivity) <2 <18 ng/L    Comment: (NOTE) Elevated high sensitivity troponin I (hsTnI) values and significant  changes across serial measurements may suggest ACS but many other  chronic and acute conditions are known to elevate hsTnI results.  Refer to the "Links" section for chest pain algorithms and additional  guidance. Performed at [facility], [address] [place], [place] [address]   Rapid urine drug screen (hospital performed)     Status: Abnormal   Collection Time: 07/15/21  5:09 AM  Result Value Ref Range   Opiates NONE DETECTED NONE DETECTED   Cocaine NONE DETECTED NONE DETECTED   Benzodiazepines NONE DETECTED NONE DETECTED   Amphetamines NONE DETECTED NONE DETECTED   Tetrahydrocannabinol POSITIVE (A) NONE DETECTED   Barbiturates NONE DETECTED NONE DETECTED    Comment: (NOTE) DRUG SCREEN FOR MEDICAL PURPOSES ONLY.  IF CONFIRMATION IS NEEDED FOR ANY PURPOSE, NOTIFY LAB WITHIN 5 DAYS.  LOWEST DETECTABLE LIMITS FOR URINE DRUG SCREEN Drug Class                     Cutoff (ng/mL) Amphetamine and metabolites    1000 Barbiturate and metabolites    200 Benzodiazepine                 200 Tricyclics and metabolites     300 Opiates and metabolites        300 Cocaine and metabolites        300 THC                            50 Performed at [facility], [address] [place], [place] [address]   Urinalysis, Routine w reflex microscopic Urine, Catheterized     Status: Abnormal   Collection Time: 07/15/21  5:09 AM  Result Value Ref Range   Color, Urine YELLOW YELLOW   APPearance HAZY (A) CLEAR   Specific Gravity, Urine 1.023  1.005 - 1.030   pH 5.0 5.0 - 8.0   Glucose, UA NEGATIVE NEGATIVE mg/dL   Hgb urine  dipstick NEGATIVE NEGATIVE   Bilirubin Urine NEGATIVE NEGATIVE   Ketones, ur NEGATIVE NEGATIVE mg/dL   Protein, ur NEGATIVE NEGATIVE mg/dL   Nitrite NEGATIVE NEGATIVE   Leukocytes,Ua NEGATIVE NEGATIVE    Comment: Performed at [facility], [address], [place], [place] [address]  Blood gas, arterial (at [facility])     Status: Abnormal   Collection Time: 07/15/21  5:35 AM  Result Value Ref Range   FIO2 100.00    Delivery systems VENTILATOR    Mode PRESSURE REGULATED VOLUME CONTROL    VT 370 mL   LHR 16 resp/min   Peep/cpap 5.0 cm H20   pH, Arterial 7.374 7.350 - 7.450   pCO2 arterial 35.8 32.0 - 48.0 mmHg   pO2, Arterial 375 (H) 83.0 - 108.0 mmHg   Bicarbonate 20.8 20.0 - 28.0 mmol/L   Acid-base deficit 3.8 (H) 0.0 - 2.0 mmol/L   O2 Saturation 100.0 %   Patient temperature 95.9    Collection site RIGHT BRACHIAL     Comment: Performed at [facility], [address] [place], [place] [address]   CT Head Wo Contrast  Result Date: 07/15/2021 CLINICAL DATA:  Altered mental status. EXAM: CT HEAD WITHOUT CONTRAST TECHNIQUE: Contiguous axial images were obtained from the base of the skull through the vertex without intravenous contrast. COMPARISON:  No comparison studies available. FINDINGS: Brain: There is no evidence for acute hemorrhage, hydrocephalus, mass lesion, or abnormal extra-axial fluid collection. No definite CT evidence for acute infarction. Vascular: No hyperdense vessel or unexpected calcification. Skull: No evidence for fracture. No worrisome lytic or sclerotic lesion. Sinuses/Orbits: The visualized paranasal sinuses and mastoid air cells are clear. Visualized portions of the globes and intraorbital fat are unremarkable. Other: None. IMPRESSION: No acute intracranial abnormality. Electronically Signed   By: Kennith Center M.D.   On: 07/15/2021 05:36   DG Chest Portable 1 View  Result Date: 07/15/2021 CLINICAL DATA:  Altered mental status.  EXAM: PORTABLE CHEST 1 VIEW COMPARISON:  None. FINDINGS: 0446 hours. Endotracheal tube tip is 3.2 cm above the base of the carina. NG tube tip is in the gastric fundus. Low lung volumes with streaky opacity at the bases consistent with atelectasis. Cardiopericardial silhouette is at upper limits of normal for size. The visualized bony structures of the thorax show no acute abnormality. Telemetry leads overlie the chest. IMPRESSION: 1. Endotracheal tube and NG tube as described. 2. Low lung volumes with bibasilar atelectasis. Electronically Signed   By: Kennith Center M.D.   On: 07/15/2021 05:12   DG Abd Portable 1 View  Result Date: 07/15/2021 CLINICAL DATA:  OG tube placement. EXAM: PORTABLE ABDOMEN - 1 VIEW COMPARISON:  None. FINDINGS: OG tube tip is positioned in the fundus of the stomach with the proximal side port of the tube well below the GE junction. Paucity of bowel gas noted in the visualized upper abdomen. IMPRESSION: OG tube tip is in the stomach. Electronically Signed   By: Kennith Center M.D.   On: 07/15/2021 05:12    Pending Labs Unresulted Labs (From admission, onward)    Start     Ordered   07/16/21 0500  CBC  Tomorrow morning,   R        07/15/21 0981   07/16/21 0500  Basic metabolic panel  Tomorrow morning,   R  07/15/21 0632   07/16/21 0500  Blood gas, arterial  Tomorrow morning,   R        07/15/21 0632   07/16/21 0500  Magnesium  Tomorrow morning,   R        07/15/21 0632   07/16/21 0500  Phosphorus  Tomorrow morning,   R        07/15/21 0632   07/16/21 0500  Triglycerides  (propofol (DIPRIVAN))  Every 72 hours,   R     Comments: While on propofol (DIPRIVAN)    07/15/21 0634   07/15/21 0630  Culture, Respiratory w Gram Stain (tracheal aspirate)  Once,   STAT        07/15/21 0632   07/15/21 6578  HIV Antibody (routine testing w rflx)  (HIV Antibody (Routine testing w reflex) panel)  Once,   STAT        07/15/21 0632   07/15/21 0628  CBC  (heparin)  Once,   STAT        Comments: Baseline for heparin therapy IF NOT ALREADY DRAWN.  Notify MD if PLT < 100 K.    07/15/21 4696   07/15/21 0628  Creatinine, serum  (heparin)  Once,   STAT       Comments: Baseline for heparin therapy IF NOT ALREADY DRAWN.    07/15/21 2952   07/15/21 0550  SARS CORONAVIRUS 2 (TAT 6-24 HRS) Nasopharyngeal Nasopharyngeal Swab  (Tier 3 - Symptomatic/asymptomatic)  Once,   STAT       Question Answer Comment  Is this test for diagnosis or screening Screening   Symptomatic for COVID-19 as defined by CDC No   Hospitalized for COVID-19 No   Admitted to ICU for COVID-19 No   Previously tested for COVID-19 No   Resident in a congregate (group) care setting No   Employed in healthcare setting No   Pregnant No   Has patient completed COVID vaccination(s) (2 doses of Pfizer/Moderna 1 dose of [employer]) Unknown      07/15/21 0550   07/15/21 0433  Ammonia  ONCE - STAT,   STAT        07/15/21 0432   07/15/21 0433  TSH  ONCE - STAT,   STAT        07/15/21 0432   07/15/21 0419  Lactic acid, plasma  Now then every 2 hours,   STAT      07/15/21 0418   07/15/21 0419  Blood culture (routine x 2)  BLOOD CULTURE X 2,   STAT      07/15/21 0418   07/15/21 0418  Ethanol  ONCE - STAT,   STAT        07/15/21 0418   07/15/21 0418  Salicylate level  Once,   STAT        07/15/21 0418   07/15/21 0418  Acetaminophen level  Once,   STAT        07/15/21 0418          Vitals/Pain Today's Vitals   07/15/21 0603 07/15/21 0605 07/15/21 0615 07/15/21 0625  BP: 119/73 118/76  (!) 116/54  Pulse: (!) 52 (!) 51 (!) 49 (!) 48  Resp: 16 16 16 16   Temp: (!) 95.8 F (35.4 C) (!) 95.8 F (35.4 C) (!) 96 F (35.6 C) (!) 96.2 F (35.7 C)  TempSrc:      SpO2: 100% 100% 100% 100%  Weight:      Height:  PainSc:        Isolation Precautions No active isolations  Medications Medications  docusate sodium (COLACE) capsule 100 mg (has no administration in time range)  polyethylene glycol  (MIRALAX / GLYCOLAX) packet 17 g (has no administration in time range)  heparin injection 5,000 Units (has no administration in time range)  0.9 % NaCl with KCl 20 mEq/ L  infusion (has no administration in time range)  acetaminophen (TYLENOL) tablet 650 mg (has no administration in time range)  famotidine (PEPCID) IVPB 20 mg premix (has no administration in time range)  albuterol (PROVENTIL) (2.5 MG/3ML) 0.083% nebulizer solution 2.5 mg (has no administration in time range)  docusate (COLACE) 50 MG/5ML liquid 100 mg (has no administration in time range)  polyethylene glycol (MIRALAX / GLYCOLAX) packet 17 g (has no administration in time range)  propofol (DIPRIVAN) 1000 MG/100ML infusion (25 mcg/kg/min  73.8 kg Intravenous Rate/Dose Verify 07/15/21 0637)  fentaNYL (SUBLIMAZE) injection 25 mcg (has no administration in time range)  fentaNYL (SUBLIMAZE) injection 25-100 mcg (has no administration in time range)  naloxone [facility](NARCAN) 2 MG/2ML injection (2 mg  Given 07/15/21 0405)  sodium chloride 0.9 % bolus 1,000 mL (0 mLs Intravenous Stopped 07/15/21 0604)  etomidate (AMIDATE) injection ( Intravenous Canceled Entry 07/15/21 0430)  succinylcholine (ANECTINE) injection ( Intravenous Canceled Entry 07/15/21 0430)  propofol (DIPRIVAN) 1000 MG/100ML infusion (0 mcg/kg/min  73.8 kg Intravenous Paused 07/15/21 0636)  midazolam (VERSED) 5 MG/5ML injection (5 mg Intravenous Given 07/15/21 0450)  fentaNYL (SUBLIMAZE) injection (50 mcg Intravenous Given 07/15/21 0450)    Mobility non-ambulatory

## 2021-07-15 NOTE — ED Notes (Signed)
Versed drip stopped per Ardeth Perfect, MD.

## 2021-07-15 NOTE — ED Notes (Signed)
Patient transported to CT 

## 2021-07-15 NOTE — ED Provider Notes (Signed)
[facility]-EMERGENCY DEPT Provider Note   CSN: [id number] Arrival date & time: 07/15/21  0400     History Chief Complaint  Patient presents with   Drug Overdose    Alexis Sloan is a 65 y.o. female presents emergency department via EMS from home with complaints of overdose.  She arrives altered and gurgling.  Unable to protect her own airway at this time.  Follows no commands.  Only opens eyes to pain.    I personally evaluated the patient earlier tonight.  At that time it was reported that she took 3 Gummies of 35 mg delta 8 around 9 PM.  Poison control was contacted and they recommended 4-hour observation.  Patient and family decided to leave around 3 hours and all were without symptoms.  Patient alert, oriented, interactive, smiling and walking without difficulty.  Son reports that approximately 1 hour ago his mother woke him and stated that she felt poorly.  He reported that she was pale and diaphoretic.  She experienced a syncopal episode but did not hit her head as he was able to lower her to the ground.  He reports that at that point she began to act like she was going to vomit and 911 was contacted.  He reports that when they arrived home from the emergency department she was well and had no complaints.  He reports she did not eat any additional Gummies or take any other medications.  He denies any alcohol usage.  Denies falls, hitting her head or seizure-like activity.  LEVEL 5 CAVEAT for AMS.   The history is provided by medical records, the EMS personnel and a relative. The history is limited by the condition of the patient.      Past Medical History:  Diagnosis Date   Hypertension     There are no problems to display for this patient.   History reviewed. No pertinent surgical history.   OB History   No obstetric history on file.     History reviewed. No pertinent family history.  Social History   Tobacco Use   Smoking  status: Never    Passive exposure: Never   Smokeless tobacco: Never  Substance Use Topics   Alcohol use: Never   Drug use: Never    Home Medications Prior to Admission medications   Not on File    Allergies    Patient has no known allergies.  Review of Systems   Review of Systems  Unable to perform ROS: Acuity of condition   Physical Exam Updated Vital Signs BP (!) 114/56   Pulse (!) 55   Temp (!) 95.2 F (35.1 C)   Resp 16   Ht 5' (1.524 m)   Wt 73.8 kg   SpO2 100%   BMI 31.79 kg/m   Physical Exam Vitals and nursing note reviewed.  Constitutional:      General: She is in acute distress.     Appearance: She is well-developed. She is not ill-appearing.     Comments: Patient open eyes to painful stimuli, answers no questions, follows no commands  HENT:     Head: Normocephalic.  Eyes:     General: No scleral icterus.    Conjunctiva/sclera: Conjunctivae normal.     Comments: Pupils slightly greater than midpoint and sluggish  Cardiovascular:     Rate and Rhythm: Bradycardia present.     Pulses:          Radial pulses are 2+ on the right  side and 2+ on the left side.       Dorsalis pedis pulses are 2+ on the right side and 2+ on the left side.  Pulmonary:     Effort: Pulmonary effort is normal.     Breath sounds: Rhonchi (throughout) present.  Abdominal:     General: There is no distension.     Palpations: Abdomen is soft.  Musculoskeletal:        General: Normal range of motion.     Cervical back: Normal range of motion.  Skin:    General: Skin is cool and moist.     Coloration: Skin is pale.     Comments: Pale and diaphoretic, cool to the touch  Neurological:     Mental Status: She is lethargic.     GCS: GCS eye subscore is 2. GCS verbal subscore is 2. GCS motor subscore is 3.     Comments: Patient lethargic, GCS 7.  Arouses to pain only.  Follows no commands, answers no questions.  Psychiatric:        Mood and Affect: Mood normal.    ED Results /  Procedures / Treatments   Labs (all labs ordered are listed, but only abnormal results are displayed) Labs Reviewed  CBC WITH DIFFERENTIAL/PLATELET - Abnormal; Notable for the following components:      Result Value   WBC 12.1 (*)    Lymphs Abs 4.1 (*)    All other components within normal limits  COMPREHENSIVE METABOLIC PANEL - Abnormal; Notable for the following components:   CO2 19 (*)    Glucose, Bld 196 (*)    All other components within normal limits  RAPID URINE DRUG SCREEN, HOSP PERFORMED - Abnormal; Notable for the following components:   Tetrahydrocannabinol POSITIVE (*)    All other components within normal limits  URINALYSIS, ROUTINE W REFLEX MICROSCOPIC - Abnormal; Notable for the following components:   APPearance HAZY (*)    All other components within normal limits  BLOOD GAS, ARTERIAL - Abnormal; Notable for the following components:   pO2, Arterial 375 (*)    Acid-base deficit 3.8 (*)    All other components within normal limits  CULTURE, BLOOD (ROUTINE X 2)  CULTURE, BLOOD (ROUTINE X 2)  SARS CORONAVIRUS 2 (TAT 6-24 HRS)  CK  ETHANOL  SALICYLATE LEVEL  ACETAMINOPHEN LEVEL  LACTIC ACID, PLASMA  LACTIC ACID, PLASMA  AMMONIA  TSH  TROPONIN I (HIGH SENSITIVITY)  TROPONIN I (HIGH SENSITIVITY)    EKG EKG Interpretation  Date/Time:  Monday July 15 2021 05:01:55 EDT Ventricular Rate:  53 PR Interval:  142 QRS Duration: 107 QT Interval:  471 QTC Calculation: 443 R Axis:   44 Text Interpretation: Sinus rhythm No previous ECGs available Confirmed by Glynn Octave [phone]) on 07/15/2021 5:09:52 AM  Radiology CT Head Wo Contrast  Result Date: 07/15/2021 CLINICAL DATA:  Altered mental status. EXAM: CT HEAD WITHOUT CONTRAST TECHNIQUE: Contiguous axial images were obtained from the base of the skull through the vertex without intravenous contrast. COMPARISON:  No comparison studies available. FINDINGS: Brain: There is no evidence for acute hemorrhage,  hydrocephalus, mass lesion, or abnormal extra-axial fluid collection. No definite CT evidence for acute infarction. Vascular: No hyperdense vessel or unexpected calcification. Skull: No evidence for fracture. No worrisome lytic or sclerotic lesion. Sinuses/Orbits: The visualized paranasal sinuses and mastoid air cells are clear. Visualized portions of the globes and intraorbital fat are unremarkable. Other: None. IMPRESSION: No acute intracranial abnormality. Electronically Signed   By:  Kennith CenterEric  Mansell M.D.   On: 07/15/2021 05:36   DG Chest Portable 1 View  Result Date: 07/15/2021 CLINICAL DATA:  Altered mental status. EXAM: PORTABLE CHEST 1 VIEW COMPARISON:  None. FINDINGS: 0446 hours. Endotracheal tube tip is 3.2 cm above the base of the carina. NG tube tip is in the gastric fundus. Low lung volumes with streaky opacity at the bases consistent with atelectasis. Cardiopericardial silhouette is at upper limits of normal for size. The visualized bony structures of the thorax show no acute abnormality. Telemetry leads overlie the chest. IMPRESSION: 1. Endotracheal tube and NG tube as described. 2. Low lung volumes with bibasilar atelectasis. Electronically Signed   By: Kennith CenterEric  Mansell M.D.   On: 07/15/2021 05:12   DG Abd Portable 1 View  Result Date: 07/15/2021 CLINICAL DATA:  OG tube placement. EXAM: PORTABLE ABDOMEN - 1 VIEW COMPARISON:  None. FINDINGS: OG tube tip is positioned in the fundus of the stomach with the proximal side port of the tube well below the GE junction. Paucity of bowel gas noted in the visualized upper abdomen. IMPRESSION: OG tube tip is in the stomach. Electronically Signed   By: Kennith CenterEric  Mansell M.D.   On: 07/15/2021 05:12    Procedures .Critical Care  Date/Time: 07/15/2021 5:05 AM Performed by: Dierdre ForthMuthersbaugh, Lamonta Cypress, PA-C Authorized by: Dierdre ForthMuthersbaugh, Maragret Vanacker, PA-C   Critical care provider statement:    Critical care time (minutes):  75   Critical care time was exclusive of:   Separately billable procedures and treating other patients and teaching time   Critical care was necessary to treat or prevent imminent or life-threatening deterioration of the following conditions:  Respiratory failure   Critical care was time spent personally by me on the following activities:  Discussions with consultants, evaluation of patient's response to treatment, examination of patient, ordering and performing treatments and interventions, ordering and review of laboratory studies, ordering and review of radiographic studies, pulse oximetry, re-evaluation of patient's condition, obtaining history from patient or surrogate and review of old charts   I assumed direction of critical care for this patient from another provider in my specialty: no     Care discussed with: admitting provider   Date/Time: 07/15/2021 5:06 AM Performed by: Dierdre ForthMuthersbaugh, Maudry Zeidan, PA-C Pre-anesthesia Checklist: Patient identified, Emergency Drugs available, Suction available and Patient being monitored Oxygen Delivery Method: Ambu bag Preoxygenation: Pre-oxygenation with 100% oxygen Induction Type: Rapid sequence Ventilation: Mask ventilation without difficulty Laryngoscope Size: Glidescope and 3 Tube size: 7.0 mm Number of attempts: 1 Airway Equipment and Method: Rigid stylet and Video-laryngoscopy Placement Confirmation: ETT inserted through vocal cords under direct vision, CO2 detector and Breath sounds checked- equal and bilateral Secured at: 24 cm Tube secured with: ETT holder Dental Injury: Teeth and Oropharynx as per pre-operative assessment       Medications Ordered in ED Medications  fentaNYL (SUBLIMAZE) injection 50 mcg (has no administration in time range)  fentaNYL (SUBLIMAZE) injection 50 mcg (has no administration in time range)  propofol (DIPRIVAN) 1000 MG/100ML infusion (5 mcg/kg/min  73.5 kg Intravenous Not Given 07/15/21 0431)  midazolam (VERSED) injection 5 mg (5 mg Intravenous Not Given  07/15/21 0506)  midazolam (VERSED) 50 mg/50 mL (1 mg/mL) premix infusion (0.5 mg/hr Intravenous New Bag/Given 07/15/21 0538)  naloxone [facility](NARCAN) 2 MG/2ML injection (2 mg  Given 07/15/21 0405)  sodium chloride 0.9 % bolus 1,000 mL (1,000 mLs Intravenous New Bag/Given 07/15/21 0500)  etomidate (AMIDATE) injection ( Intravenous Canceled Entry 07/15/21 0430)  succinylcholine (ANECTINE) injection ( Intravenous  Canceled Entry 07/15/21 0430)  propofol (DIPRIVAN) 1000 MG/100ML infusion (15 mcg/kg/min  73.8 kg Intravenous Rate/Dose Change 07/15/21 0536)  midazolam (VERSED) 5 MG/5ML injection (5 mg Intravenous Given 07/15/21 0450)  fentaNYL (SUBLIMAZE) injection (50 mcg Intravenous Given 07/15/21 0450)    ED Course  I have reviewed the triage vital signs and the nursing notes.  Pertinent labs & imaging results that were available during my care of the patient were reviewed by me and considered in my medical decision making (see chart for details).    MDM Rules/Calculators/A&P                          Patient presents the emergency department via EMS.  She is hypothermic, bradycardic, altered and not protecting her airway.  Normal blood glucose.  She arouses to painful stimuli but is unable to answer questions or follow commands.  Gurgling requiring repetitive suctioning.  Given patient's severe altered mental status and inability to protect airway she was intubated.  Appears nonfocal on exam however CT scan pending for further assessment.  Patient will be admitted to [facility].  The patient was discussed with and evaluated by Dr. Manus Gunning who agrees with the treatment plan.  5:07 AM Labs are overall reassuring.  Drug screen positive for THC as expected.  No other drugs in her system.  Negative for ethanol.  CT scan without acute abnormality.  Suspect acute drug intoxication.  Discussed with PCCM who will admit.  BP 105/62   Pulse (!) 53   Temp (!) 95.8 F (35.4 C)   Resp 16   Ht 5' (1.524 m)   Wt 73.8 kg    SpO2 100%   BMI 31.79 kg/m     Final Clinical Impression(s) / ED Diagnoses Final diagnoses:  Accidental drug overdose, initial encounter  Hypothermia, initial encounter    Rx / DC Orders ED Discharge Orders     None        Aliviana Burdell, Boyd Kerbs 07/15/21 0556    Glynn Octave, MD 07/16/21 [phone]

## 2021-07-15 NOTE — Progress Notes (Signed)
Nutrition Brief Note  Patient identified for new vent and consult for TF initiation and management.   Wt Readings from Last 15 Encounters:  07/15/21 73.8 kg  07/14/21 73.5 kg    Body mass index is 31.79 kg/m. Patient meets criteria for obesity based on current BMI. Weight today is 162 lb and the only other weight documented in the chart was 159 lb at [facility] [place] on 02/13/20. Skin WDL.   Current diet order is NPO.  Patient is currently intubated with OGT in place. Able to talk with CCM and order is in place for extubation.   No nutrition interventions warranted at this time. If nutrition issues arise, or patient unable to be extubated, please re-consult RD.       Trenton Gammon, MS, RD, LDN, CNSC Inpatient Clinical Dietitian RD pager # available in AMION  After hours/weekend pager # available in [facility]

## 2021-07-15 NOTE — Discharge Instructions (Addendum)
1. Medications: usual home medications 2. Treatment: rest, drink plenty of fluids,  3. Follow Up: Please followup with your primary doctor in 2-3 days for discussion of your diagnoses and further evaluation after today's visit; if you do not have a primary care doctor use the resource guide provided to find one; Please return to the ER for new or worsening symptoms  

## 2021-07-16 ENCOUNTER — Inpatient Hospital Stay (HOSPITAL_COMMUNITY): Payer: Medicaid Other

## 2021-07-16 LAB — BASIC METABOLIC PANEL
Anion gap: 7 (ref 5–15)
BUN: 10 mg/dL (ref 8–23)
CO2: 23 mmol/L (ref 22–32)
Calcium: 8.5 mg/dL — ABNORMAL LOW (ref 8.9–10.3)
Chloride: 110 mmol/L (ref 98–111)
Creatinine, Ser: 0.51 mg/dL (ref 0.44–1.00)
GFR, Estimated: >60 mL/min (ref >60)
Glucose, Bld: 108 mg/dL — ABNORMAL HIGH (ref 70–99)
Potassium: 3.5 mmol/L (ref 3.5–5.1)
Sodium: 140 mmol/L (ref 135–145)

## 2021-07-16 LAB — CBC
HCT: 37.2 % (ref 36.0–46.0)
Hemoglobin: 12.2 g/dL (ref 12.0–15.0)
MCH: 31 pg (ref 26.0–34.0)
MCHC: 32.8 g/dL (ref 30.0–36.0)
MCV: 94.7 fL (ref 80.0–100.0)
Platelets: 189 10*3/uL (ref 150–400)
RBC: 3.93 MIL/uL (ref 3.87–5.11)
RDW: 13.2 % (ref 11.5–15.5)
WBC: 10 10*3/uL (ref 4.0–10.5)
nRBC: 0 % (ref 0.0–0.2)

## 2021-07-16 LAB — GLUCOSE, CAPILLARY
Glucose-Capillary: 100 mg/dL — ABNORMAL HIGH (ref 70–99)
Glucose-Capillary: 111 mg/dL — ABNORMAL HIGH (ref 70–99)
Glucose-Capillary: 113 mg/dL — ABNORMAL HIGH (ref 70–99)
Glucose-Capillary: 93 mg/dL (ref 70–99)
Glucose-Capillary: 98 mg/dL (ref 70–99)

## 2021-07-16 LAB — PHOSPHORUS: Phosphorus: 3 mg/dL (ref 2.5–4.6)

## 2021-07-16 LAB — HEMOGLOBIN A1C
Hgb A1c MFr Bld: 6.1 % — ABNORMAL HIGH (ref 4.8–5.6)
Mean Plasma Glucose: 128.37 mg/dL

## 2021-07-16 LAB — MAGNESIUM: Magnesium: 2.1 mg/dL (ref 1.7–2.4)

## 2021-07-16 MED ORDER — PHENOL 1.4 % MT LIQD: 1.0000 | OROMUCOSAL | Status: DC | PRN

## 2021-07-16 MED ORDER — MENTHOL 3 MG MT LOZG: 1.0000 | LOZENGE | OROMUCOSAL | Status: DC | PRN

## 2021-07-16 MED ORDER — POTASSIUM CHLORIDE CRYS ER 20 MEQ PO TBCR
40.0000 meq | EXTENDED_RELEASE_TABLET | Freq: Once | ORAL | Status: AC
  Administered 2021-07-16: 40 meq via ORAL
  Filled 2021-07-16: qty 2

## 2021-07-16 NOTE — Progress Notes (Signed)
eLink Physician-Brief Progress Note Patient Name: Alexis Sloan DOB: 02-28-1956 MRN: [id number]   Date of Service  07/16/2021  HPI/Events of Note  Patient extubated yesterday and has done well.   eICU Interventions  Plan: D/C ABG in AM.     Intervention Category Major Interventions: Other:  Lenell Antu 07/16/2021, 3:30 AM

## 2021-07-16 NOTE — Progress Notes (Signed)
NAME:  Alexis Sloan, MRN:  [id number], DOB:  1956/10/03, LOS: 1 ADMISSION DATE:  07/15/2021 CONSULTATION DATE:  07/15/2021 REFERRING MD:  [facility] - EDP CHIEF COMPLAINT:  AMS   History of Present Illness:  65 year old female with PMHx significant for HTN who presented to [facility] ED 7/18 via EMS for altered mental status. Patient had been discharged from [facility] ED approximately one hour prior to re-presentation. Initially presented after mistakenly consuming three THC gummies (35mg  Delta 8), Poison Control was contacted and advised observation  x 4 hours. Patient remained in ED and was discharged home near her baseline. About an hour after discharge, patient was noted to be pale and diaphoretic and had a syncopal episode (without head injury). She nearly vomited, became altered and began gurgling. EMS was called and brought patient to [facility] ED where she was intubated for airway protection.  On arrival to ED, patient was bradycardic and hypothermic. Labs were overall reassuring, UDS positive for THC as expected by otherwise negative. CT Head was completed without evidence of acute intracranial abnormality.  PCCM contacted for admission.  Pertinent Medical History:   Past Medical History:  Diagnosis Date   Hypertension    Significant Hospital Events: Including procedures, antibiotic start and stop dates in addition to other pertinent events   7/17 Presented to [facility] ED after accidental consumption of three 35mg  Delta 8 THC gummies. Poison Control contacted. Observed x 4 hours in ED and d/c home at baseline 7/18 Returned to [facility] ED via EMS after becoming altered at home. Unable to protect airway and intubated. PCCM admitted. Transferred to ICU for monitoring. Bradycardic, sedation weaned, following commands appropriately. Extubated. 7/19 Bradycardic overnight to 30s with occasional "pause" per RN. More awake and interactive today. Diet advanced.  Interim History / Subjective:  Bradycardic overnight,  dipping to 30s with occasional "pause" No change in mental status or LOC during these pauses More alert and interactive this morning Endorses wanting to try some solid food Denies pain, CP/SOB, nausea Stable, can move out of ICU today  Objective   Blood pressure (!) 143/62, pulse (!) 59, temperature 99.32 F (37.4 C), temperature source Bladder, resp. rate 19, height 5' (1.524 m), weight 73.8 kg, SpO2 97 %.    Vent Mode: PRVC FiO2 (%):  [30 %] 30 % Set Rate:  [12 bmp] 12 bmp Vt Set:  [370 mL] 370 mL PEEP:  [5 cmH20] 5 cmH20 Plateau Pressure:  [16 cmH20] 16 cmH20   Intake/Output Summary (Last 24 hours) at 07/16/2021 0752 Last data filed at 07/16/2021 0326 Gross per 24 hour  Intake 1083.57 ml  Output 1725 ml  Net -641.43 ml   Filed Weights   07/15/21 0430  Weight: 73.8 kg   Physical Examination: General: WDWN middle-aged female in NAD. HEENT: [place]/AT, anicteric sclera, PERRL, moist mucous membranes. Neuro: Awake, oriented x 4. Responds to verbal stimuli. Following commands consistently. Moves all 4 extremities spontaneously. CV: Bradycardic, no m/g/r. PULM: Breathing even and unlabored on RA. Lung fields diminished bilaterally. GI: Soft, nontender, nondistended. Normoactive bowel sounds. Extremities: No LE edema noted. Skin: Warm/dry, no rashes.  Resolved Hospital Problem List:  Acute hypoxemic respiratory failure 2/2 AMS  Assessment & Plan:  Acute toxic encephalopathy secondary to THC use, improving BIB EMS after accidental consumption of three 35mg  Delta 8 THC gummies. Poison Control contacted and recommended obs x 4 hours. Discharged home but returned for AMS and inability to protect airway. UDS positive for THC as expected, otherwise negative. Ethanol and  additional substance detection negative. CT Head NAICA. - Mental status continues to improve - Off sedation, following commands - Interactive and alert - Continue to monitor neurologic status  Bibasilar  atelectasis Intubated in the ED in the setting of AMS and inability to protect airway. Extubated < 24H later (7/18). CXR with bibasilar atelectasis. - Wean O2 for sat > 90%, on RA currently - Pulmonary hygiene - Encourage IS - Follow intermittent CXR  Acute leukocytosis, improving WBC 12.1 on admission. CXR unremarkable. - Trend WBC, fever curve - F/u finalized BCx  Bradycardia Likely in the setting of sedation (Propofol) vs. Substance use. EKG 7/18 consistent with sinus bradycardia. - Cardiac monitoring - Given persistent bradycardia despite time out from [facility] consumption and off sedation, consider Cardiology consult if worsening  Hypertension History of hypertension. - Continue to monitor - Does not take any antihypertensive agents at home  Best Practice: (right click and "Reselect all SmartList Selections" daily)   Diet/type: NPO DVT prophylaxis: prophylactic heparin  GI prophylaxis: H2B Lines: N/A Foley:  Yes, and it is no longer needed Code Status:  full code Last date of multidisciplinary goals of care discussion [Pending]  Critical care time: N/A   Faythe Ghee [employer] [facility] 07/16/21 7:52 AM  Please see [email or web address] for pager details.  From 7A-7P if no response, please call [phone] After hours, please call ELink [phone]

## 2021-07-16 NOTE — Progress Notes (Signed)
[facility] ADULT ICU REPLACEMENT PROTOCOL   The patient does apply for the [facility] Adult ICU Electrolyte Replacment Protocol based on the criteria listed below:   1.Exclusion criteria: TCTS patients, ECMO patients and Hypothermia Protocol, and   Dialysis patients 2. Is GFR >/= 30 ml/min? Yes.    Patient's GFR today is >60 3. Is SCr </= 2? No Patient's SCr is 0.51 mg/dL 4. Did SCr increase >/= 0.5 in 24 hours? No. 5.Pt's weight >40kg  Yes.   6. Abnormal electrolyte(s): K+ 3.5  7. Electrolytes replaced per protocol 8.  Call MD STAT for K+ </= 2.5, Phos </= 1, or Mag </= 1 Physician:  n/a  Alexis Sloan 07/16/2021 3:46 AM

## 2021-07-17 LAB — BASIC METABOLIC PANEL
Anion gap: 8 (ref 5–15)
BUN: 15 mg/dL (ref 8–23)
CO2: 25 mmol/L (ref 22–32)
Calcium: 8.9 mg/dL (ref 8.9–10.3)
Chloride: 107 mmol/L (ref 98–111)
Creatinine, Ser: 0.71 mg/dL (ref 0.44–1.00)
GFR, Estimated: >60 mL/min (ref >60)
Glucose, Bld: 105 mg/dL — ABNORMAL HIGH (ref 70–99)
Potassium: 4.1 mmol/L (ref 3.5–5.1)
Sodium: 140 mmol/L (ref 135–145)

## 2021-07-17 LAB — CBC
HCT: 38 % (ref 36.0–46.0)
Hemoglobin: 13 g/dL (ref 12.0–15.0)
MCH: 31.1 pg (ref 26.0–34.0)
MCHC: 34.2 g/dL (ref 30.0–36.0)
MCV: 90.9 fL (ref 80.0–100.0)
Platelets: UNDETERMINED 10*3/uL (ref 150–400)
RBC: 4.18 MIL/uL (ref 3.87–5.11)
RDW: 13.1 % (ref 11.5–15.5)
WBC: 7 10*3/uL (ref 4.0–10.5)
nRBC: 0 % (ref 0.0–0.2)

## 2021-07-17 LAB — PHOSPHORUS: Phosphorus: 3.5 mg/dL (ref 2.5–4.6)

## 2021-07-17 LAB — MAGNESIUM: Magnesium: 2 mg/dL (ref 1.7–2.4)

## 2021-07-17 NOTE — Progress Notes (Signed)
PROGRESS NOTE    Alexis Sloan  SWH:[id number] DOB: 06-Jan-1956 DOA: 07/15/2021 PCP: Pcp, No   Chief Complaint  Patient presents with   Drug Overdose  Brief Narrative: 65 old female with history of hypertension brought to the ED on 7/18 with altered mental status.  She was discharged from ED approximately 1 hour prior to admission initially presented after mistakenly consuming THC Gummies 35 mg delta 8, Poison control was contacted and advised observation x4 hours then discharged home with baseline status but an hour after discharge was noted to be pale diaphoretic and had syncopal episode without head injury In the ED bradycardic hypothermic UDS positive for THC CT head no acute finding Patient was was unable to protect airway so was intubated, she was subsequently extubated  Patient was transferred to medical floor 7/20 and being monitored due to bradycardia.  Subjective: Seen examined this morning alert awake oriented complains of some chest pain reproducible with palpation. Heart rate overnight lowest 41 currently in 76 sinus.  Assessment & Plan:  Toxic metabolic encephalopathy secondary to YKZ:[id number]. Monitor.  Poison control was contacted by ED.  CT head no acute finding assessment off sedation extubated doing well neurologically at this time.  Acute hypoxic respiratory failure due to above-was intubated and has been extubated doing well on room air  Sinus bradycardia up to 30s 7/19 while in ICU, overnight lowest was 41.  This morning complains of chest pain.  Suspecting in the setting of accidental THC overdose, consulted cardiology.  History of hypertension not on any medication at home.  Blood pressure stable  Leukocytosis-resolved Recent Labs  Lab 07/15/21 0418 07/16/21 0234 07/17/21 0452  WBC 12.1* 10.0 7.0    Morbid obesity BMI 32.  Will benefit with weight loss.  Diet Order             Diet regular Room service appropriate? Yes; Fluid  consistency: Thin  Diet effective now                   Patient's Body mass index is 32.85 kg/m.  DVT prophylaxis: heparin injection 5,000 Units Start: 07/15/21 1400 SCDs Start: 07/15/21 7939 Code Status:   Code Status: Full Code  Family Communication: plan of care discussed with patient at bedside. Status is: Inpatient Remains inpatient appropriate because:Inpatient level of care appropriate due to severity of illness Dispo: The patient is from: Home              Anticipated d/c is to: Home              Patient currently is not medically stable to d/c.   Difficult to place patient No Unresulted Labs (From admission, onward)     Start     Ordered   07/15/21 0630  Culture, Respiratory w Gram Stain (tracheal aspirate)  Once,   STAT        07/15/21 0300           Medications reviewed:  Scheduled Meds:  heparin  5,000 Units Subcutaneous Q8H   Continuous Infusions:  sodium chloride Stopped (07/15/21 2356)   Consultants:see note  Procedures:see note Antimicrobials: Anti-infectives (From admission, onward)    None      Culture/Microbiology    Component Value Date/Time   SDES  07/15/2021 0629    BLOOD BLOOD RIGHT HAND Performed at [facility], [address], [place], [place] [address]    SPECREQUEST  07/15/2021 [phone]    BOTTLES DRAWN AEROBIC ONLY Blood  Culture results may not be optimal due to an excessive volume of blood received in culture bottles Performed at [facility], [address] [place], [place] [address]    CULT  07/15/2021 [id number]    NO GROWTH 2 DAYS Performed at [facility], [address] [place], [place] [address]    REPTSTATUS PENDING 07/15/2021 [id number]    Other culture-see note  Objective: Vitals: Today's Vitals   07/17/21 0500 07/17/21 0547 07/17/21 0956 07/17/21 1017  BP:  133/75 (!) 146/72   Pulse:  (!) 55 67   Resp:  18    Temp:  98.4 F (36.9 C)    TempSrc:  Oral    SpO2:  97%     Weight: 76.3 kg     Height:      PainSc:    6    No intake or output data in the 24 hours ending 07/17/21 1150 Filed Weights   07/15/21 0430 07/17/21 0500  Weight: 73.8 kg 76.3 kg   Weight change:   Intake/Output from previous day: 07/19 0701 - 07/20 0700 In: 319.9 [I.V.:319.9] Out: 475 [Urine:475] Intake/Output this shift: No intake/output data recorded. Filed Weights   07/15/21 0430 07/17/21 0500  Weight: 73.8 kg 76.3 kg   Examination: General exam: AAO 3 , older than stated age, weak appearing. HEENT:Oral mucosa moist, Ear/Nose WNL grossly,dentition normal. Respiratory system: bilaterally diminished, no use of accessory muscle, non tender.  Tender anterior chest wall Cardiovascular system: S1 & S2 +,No JVD. Gastrointestinal system: Abdomen soft, NT,ND, BS+. Nervous System:Alert, awake, moving extremities Extremities: no edema, distal peripheral pulses palpable.  Skin: No rashes,no icterus. MSK: Normal muscle bulk,tone, power Data Reviewed: I have personally reviewed following labs and imaging studies CBC: Recent Labs  Lab 07/15/21 0418 07/16/21 0234 07/17/21 0452  WBC 12.1* 10.0 7.0  NEUTROABS 7.1  --   --   HGB 13.8 12.2 13.0  HCT 41.9 37.2 38.0  MCV 93.3 94.7 90.9  PLT 241 189 PLATELET CLUMPS NOTED ON SMEAR, UNABLE TO ESTIMATE   Basic Metabolic Panel: Recent Labs  Lab 07/15/21 0418 07/16/21 0234 07/17/21 0452  NA 138 140 140  K 3.9 3.5 4.1  CL 106 110 107  CO2 19* 23 25  GLUCOSE 196* 108* 105*  BUN 16 10 15   CREATININE 0.70 0.51 0.71  CALCIUM 9.1 8.5* 8.9  MG  --  2.1 2.0  PHOS  --  3.0 3.5   GFR: Estimated Creatinine Clearance: 64 mL/min (by C-G formula based on SCr of 0.71 mg/dL). Liver Function Tests: Recent Labs  Lab 07/15/21 0418  AST 24  ALT 29  ALKPHOS 54  BILITOT 0.5  PROT 7.7  ALBUMIN 4.2   No results for input(s): LIPASE, AMYLASE in the last 168 hours. Recent Labs  Lab 07/15/21 0610  AMMONIA 26   Coagulation Profile: No  results for input(s): INR, PROTIME in the last 168 hours. Cardiac Enzymes: Recent Labs  Lab 07/15/21 0418  CKTOTAL 115   BNP (last 3 results) No results for input(s): PROBNP in the last 8760 hours. HbA1C: Recent Labs    07/15/21 2032  HGBA1C 6.1*   CBG: Recent Labs  Lab 07/16/21 0012 07/16/21 0444 07/16/21 0731 07/16/21 1246 07/16/21 1626  GLUCAP 111* 100* 93 98 113*   Lipid Profile: No results for input(s): CHOL, HDL, LDLCALC, TRIG, CHOLHDL, LDLDIRECT in the last 72 hours. Thyroid Function Tests: Recent Labs    07/15/21 0610  TSH 0.819   Anemia  Panel: No results for input(s): VITAMINB12, FOLATE, FERRITIN, TIBC, IRON, RETICCTPCT in the last 72 hours. Sepsis Labs: Recent Labs  Lab 07/15/21 0610 07/15/21 0733  LATICACIDVEN 2.6* 2.7*    Recent Results (from the past 240 hour(s))  SARS CORONAVIRUS 2 (TAT 6-24 HRS) Nasopharyngeal Nasopharyngeal Swab     Status: None   Collection Time: 07/15/21  5:50 AM   Specimen: Nasopharyngeal Swab  Result Value Ref Range Status   SARS Coronavirus 2 NEGATIVE NEGATIVE Final    Comment: (NOTE) SARS-CoV-2 target nucleic acids are NOT DETECTED.  The SARS-CoV-2 RNA is generally detectable in upper and lower respiratory specimens during the acute phase of infection. Negative results do not preclude SARS-CoV-2 infection, do not rule out co-infections with other pathogens, and should not be used as the sole basis for treatment or other patient management decisions. Negative results must be combined with clinical observations, patient history, and epidemiological information. The expected result is Negative.  Fact Sheet for Patients: HairSlick.no  Fact Sheet for Healthcare Providers: quierodirigir.com  This test is not yet approved or cleared by the [place] FDA and  has been authorized for detection and/or diagnosis of SARS-CoV-2 by FDA under an Emergency Use  Authorization (EUA). This EUA will remain  in effect (meaning this test can be used) for the duration of the COVID-19 declaration under Se ction 564(b)(1) of the Act, 21 U.S.C. section 360bbb-3(b)(1), unless the authorization is terminated or revoked sooner.  Performed at [facility], [address], [place], [place] [address]   Blood culture (routine x 2)     Status: None (Preliminary result)   Collection Time: 07/15/21  6:10 AM   Specimen: BLOOD  Result Value Ref Range Status   Specimen Description   Final    BLOOD BLOOD LEFT FOREARM Performed at [facility], [address], [place], [place] [address]    Special Requests   Final    BOTTLES DRAWN AEROBIC AND ANAEROBIC Blood Culture adequate volume Performed at [facility], [address] [place], [place] [address]    Culture   Final    NO GROWTH 2 DAYS Performed at [facility], [address], [place], [place] [address]    Report Status PENDING  Incomplete  Blood culture (routine x 2)     Status: None (Preliminary result)   Collection Time: 07/15/21  6:29 AM   Specimen: BLOOD  Result Value Ref Range Status   Specimen Description   Final    BLOOD BLOOD RIGHT HAND Performed at [facility], [address], [place], [place] [address]    Special Requests   Final    BOTTLES DRAWN AEROBIC ONLY Blood Culture results may not be optimal due to an excessive volume of blood received in culture bottles Performed at [facility], [address], [place], [place] [address]    Culture   Final    NO GROWTH 2 DAYS Performed at [facility], [address] [place], [place] [address]    Report Status PENDING  Incomplete  MRSA Next Gen by PCR, Nasal     Status: None   Collection Time: 07/15/21  7:33 AM   Specimen: Nasal Mucosa; Nasal Swab  Result Value Ref Range Status   MRSA by PCR Next Gen NOT DETECTED NOT DETECTED Final    Comment:  (NOTE) The GeneXpert MRSA Assay (FDA approved for NASAL specimens only), is one component of a comprehensive MRSA  colonization surveillance program. It is not intended to diagnose MRSA infection nor to guide or monitor treatment for MRSA infections. Test performance is not FDA approved in patients less than 51 years old. Performed at [facility], [address], [place], [place] [address]      Radiology Studies: [facility] Chest Port 1 View  Result Date: 07/16/2021 CLINICAL DATA:  Intubation. EXAM: PORTABLE CHEST 1 VIEW COMPARISON:  07/15/2021. FINDINGS: Interim extubation and removal of NG tube. Heart size normal. Low lung volumes with mild bibasilar atelectasis. Mild bibasilar infiltrates cannot be excluded. Tiny left pleural effusion cannot be excluded. No pneumothorax. IMPRESSION: 1.  Interim extubation and removal of NG tube. 2. Low lung volumes with mild bibasilar atelectasis. Mild bibasilar infiltrates cannot be excluded. Tiny left pleural effusion cannot be excluded. Electronically Signed   By: Maisie Fus  Register   On: 07/16/2021 05:57     LOS: 2 days   Lanae Boast, MD [facility]  07/17/2021, 11:50 AM

## 2021-07-17 NOTE — Consult Note (Signed)
CARDIOLOGY CONSULT NOTE  Patient ID: Alexis Sloan MRN: [id number] DOB/AGE: [birth year] 65 y.o.  Admit date: 07/15/2021 Referring Physician: [facility] Reason for Consultation:  Bradycardia  HPI:    65 year old Hispanic female admitted with altered mental status and THC overdose.  Cardiology consulted for bradycardia.  Language interpreter was used for history taking.  Patient was admitted on 07/15/2021 with altered mental status and found to have had overdose from [facility] containing Gummies.  Patient denies any other drug use.  She lives with her son and her daughter.  She is ambulatory at home.  At baseline, she denies any chest pain, shortness of breath symptoms.  While in the ICU, she was reportedly noted to have bradycardia in 30s.  Heart rate has stayed greater than 50 since then.  Patient reports pain on breathing in the center of her chest, back, as well as both sides.  She denies any shortness of breath.  High-sensitivity troponin has been negative x2.   Past Medical History:  Diagnosis Date   Hypertension      History reviewed. No pertinent surgical history.   History reviewed. No pertinent family history.   Social History: Social History   Socioeconomic History   Marital status: Single    Spouse name: Not on file   Number of children: Not on file   Years of education: Not on file   Highest education level: Not on file  Occupational History   Not on file  Tobacco Use   Smoking status: Never    Passive exposure: Never   Smokeless tobacco: Never  Substance and Sexual Activity   Alcohol use: Never   Drug use: Never   Sexual activity: Not on file  Other Topics Concern   Not on file  Social History Narrative   Not on file   Social Determinants of Health   Financial Resource Strain: Not on file  Food Insecurity: Not on file  Transportation Needs: Not on file  Physical Activity: Not on file  Stress: Not on file  Social Connections: Not on file   Intimate Partner Violence: Not on file     No medications prior to admission.    Review of Systems  Constitutional: Negative for decreased appetite, malaise/fatigue, weight gain and weight loss.  HENT:  Negative for congestion.   Eyes:  Negative for visual disturbance.  Cardiovascular:  Positive for chest pain. Negative for dyspnea on exertion, leg swelling, palpitations and syncope.  Respiratory:  Negative for cough.   Endocrine: Negative for cold intolerance.  Hematologic/Lymphatic: Does not bruise/bleed easily.  Skin:  Negative for itching and rash.  Musculoskeletal:  Positive for back pain. Negative for myalgias.  Gastrointestinal:  Negative for abdominal pain, nausea and vomiting.  Genitourinary:  Negative for dysuria.  Neurological:  Negative for dizziness and weakness.  Psychiatric/Behavioral:  The patient is not nervous/anxious.   All other systems reviewed and are negative.    Physical Exam: Physical Exam Vitals and nursing note reviewed.  Constitutional:      General: She is not in acute distress.    Appearance: She is well-developed.  HENT:     Head: Normocephalic and atraumatic.  Eyes:     Conjunctiva/sclera: Conjunctivae normal.     Pupils: Pupils are equal, round, and reactive to light.  Neck:     Vascular: No JVD.  Cardiovascular:     Rate and Rhythm: Normal rate and regular rhythm.     Pulses: Normal pulses and intact distal pulses.  Heart sounds: No murmur heard. Pulmonary:     Effort: Pulmonary effort is normal.     Breath sounds: Normal breath sounds. No wheezing or rales.  Abdominal:     General: Bowel sounds are normal.     Palpations: Abdomen is soft.     Tenderness: There is no rebound.  Musculoskeletal:        General: No tenderness. Normal range of motion.     Right lower leg: No edema.     Left lower leg: No edema.  Lymphadenopathy:     Cervical: No cervical adenopathy.  Skin:    General: Skin is warm and dry.  Neurological:      Mental Status: She is alert and oriented to person, place, and time.     Cranial Nerves: No cranial nerve deficit.     Labs:   Lab Results  Component Value Date   WBC 7.0 07/17/2021   HGB 13.0 07/17/2021   HCT 38.0 07/17/2021   MCV 90.9 07/17/2021   PLT PLATELET CLUMPS NOTED ON SMEAR, UNABLE TO ESTIMATE 07/17/2021    Recent Labs  Lab 07/15/21 0418 07/16/21 0234 07/17/21 0452  NA 138   < > 140  K 3.9   < > 4.1  CL 106   < > 107  CO2 19*   < > 25  BUN 16   < > 15  CREATININE 0.70   < > 0.71  CALCIUM 9.1   < > 8.9  PROT 7.7  --   --   BILITOT 0.5  --   --   ALKPHOS 54  --   --   ALT 29  --   --   AST 24  --   --   GLUCOSE 196*   < > 105*   < > = values in this interval not displayed.    Lipid Panel  No results found for: CHOL, TRIG, HDL, CHOLHDL, VLDL, LDLCALC  BNP (last 3 results) No results for input(s): BNP in the last 8760 hours.  HEMOGLOBIN A1C Lab Results  Component Value Date   HGBA1C 6.1 (H) 07/15/2021   MPG 128.37 07/15/2021    Cardiac Panel (last 3 results) Recent Labs    07/15/21 0418  CKTOTAL 115    Lab Results  Component Value Date   CKTOTAL 115 07/15/2021     TSH Recent Labs    07/15/21 0610  TSH 0.819      Radiology: [facility] Chest Port 1 View  Result Date: 07/16/2021 CLINICAL DATA:  Intubation. EXAM: PORTABLE CHEST 1 VIEW COMPARISON:  07/15/2021. FINDINGS: Interim extubation and removal of NG tube. Heart size normal. Low lung volumes with mild bibasilar atelectasis. Mild bibasilar infiltrates cannot be excluded. Tiny left pleural effusion cannot be excluded. No pneumothorax. IMPRESSION: 1.  Interim extubation and removal of NG tube. 2. Low lung volumes with mild bibasilar atelectasis. Mild bibasilar infiltrates cannot be excluded. Tiny left pleural effusion cannot be excluded. Electronically Signed   By: Maisie Fus  Register   On: 07/16/2021 05:57    Scheduled Meds:  heparin  5,000 Units Subcutaneous Q8H   Continuous Infusions:  sodium  chloride Stopped (07/15/21 2356)   PRN Meds:.sodium chloride, acetaminophen, acetaminophen, albuterol, docusate sodium, menthol-cetylpyridinium, phenol, polyethylene glycol  CARDIAC STUDIES:  EKG 07/16/2019: Sinus rhythm 55 bpm  Echocardiogram: None   Assessment & Recommendations:   65 year old Hispanic female admitted with altered mental status and THC overdose.  Cardiology consulted for bradycardia.  Bradycardia: Likely related to [facility] overdose.  Now resolved.  Normal sinus node function and AV conduction on EKG.  Normal TSH. Do not suspect any significant bradycardia arrhythmia issues without overuse of THC.  Chest pain: Normal resting EKG.  High-sensitivity troponin negative x2.  No significant CAD risk factors other than age. Chest pain is most likely pleuritic or musculoskeletal in etiology. I do not think she needs any cardiac testing for chest pain at this time.  Cardiology will sign off.  Patient will make follow-up appointment with [place] in future, if she has recurrent chest pain issues.     Elder Negus, MD Pager: [phone] Office: [phone]

## 2021-07-18 NOTE — Evaluation (Signed)
Physical Therapy Evaluation Patient Details Name: Alexis Sloan MRN: [id number] DOB: 1956/05/03 Today's Date: 07/18/2021   History of Present Illness  65 old female brought to the ED on 7/18 with altered mental status.  She was discharged from ED approximately 1 hour prior to admission initially presented after mistakenly consuming THC Gummies 35 mg delta 8, Poison control was contacted and advised observation x4 hours then discharged home with baseline status but an hour after discharge was noted to be pale diaphoretic and had syncopal episode without head injury. PMH: HTN   Clinical Impression  Alexis Sloan is 65 y.o. female admitted with above HPI and diagnosis. Patient is currently limited by functional impairments below (see PT problem list). Patient lives with her son and is independent at baseline. Currently she is independent with bed mobility and transfers and requires supervision/min guard for safety with gait. Mild LOB noted with gait but pt able to recover balance independently. HR stable in 60's-80's with gait. Patient will benefit from continued skilled PT interventions to address impairments and progress independence with mobility in acute setting. Anticipate no needs at home. Acute PT will follow and progress as able.     Follow Up Recommendations No PT follow up    Equipment Recommendations  None recommended by PT    Recommendations for Other Services       Precautions / Restrictions Precautions Precautions: Fall Precaution Comments: low fall Restrictions Weight Bearing Restrictions: No      Mobility  Bed Mobility Overal bed mobility: Modified Independent             General bed mobility comments: HOB elevated; taking some extra time to move supine>sit EOB.    Transfers Overall transfer level: Independent Equipment used: None Transfers: Sit to/from Stand Sit to Stand: Supervision         General transfer comment: no assist  required for rise from EOB ot toilet. pt using grab bar in bathroom to power up.  Ambulation/Gait Ambulation/Gait assistance: Min guard;Supervision Gait Distance (Feet): 160 Feet Assistive device: None Gait Pattern/deviations: Step-through pattern;Decreased stride length;Drifts right/left Gait velocity: decr      Stairs            Wheelchair Mobility    Modified Rankin (Stroke Patients Only)       Balance Overall balance assessment: Mild deficits observed, not formally tested                                           Pertinent Vitals/Pain Pain Assessment: No/denies pain    Home Living Family/patient expects to be discharged to:: Private residence Living Arrangements: Children;Other relatives (grandchildren) Available Help at Discharge: Family Type of Home: House Home Access: Stairs to enter Entrance Stairs-Rails: None Entrance Stairs-Number of Steps: 3 Home Layout: One level Home Equipment: None      Prior Function Level of Independence: Independent         Comments: Independent with ADLs, IADLs and mobility. Enjoys cookiing     Hand Dominance   Dominant Hand: Right    Extremity/Trunk Assessment   Upper Extremity Assessment Upper Extremity Assessment: Overall WFL for tasks assessed;Defer to OT evaluation    Lower Extremity Assessment Lower Extremity Assessment: Overall WFL for tasks assessed    Cervical / Trunk Assessment Cervical / Trunk Assessment: Normal  Communication   Communication: Prefers language other than [place];Interpreter utilized  Cognition Arousal/Alertness: Awake/alert Behavior During Therapy: WFL for tasks assessed/performed Overall Cognitive Status: Within Functional Limits for tasks assessed                                        General Comments      Exercises     Assessment/Plan    PT Assessment Patient needs continued PT services  PT Problem List Decreased activity  tolerance;Decreased balance;Decreased mobility       PT Treatment Interventions DME instruction;Gait training;Functional mobility training;Stair training;Therapeutic activities;Therapeutic exercise;Balance training;Patient/family education    PT Goals (Current goals can be found in the Care Plan section)  Acute Rehab PT Goals Patient Stated Goal: get back home PT Goal Formulation: With patient Time For Goal Achievement: 08/01/21 Potential to Achieve Goals: Good    Frequency Min 3X/week   Barriers to discharge        Co-evaluation               AM-PAC PT "6 Clicks" Mobility  Outcome Measure Help needed turning from your back to your side while in a flat bed without using bedrails?: None Help needed moving from lying on your back to sitting on the side of a flat bed without using bedrails?: A Little Help needed moving to and from a bed to a chair (including a wheelchair)?: A Little Help needed standing up from a chair using your arms (e.g., wheelchair or bedside chair)?: A Little Help needed to walk in hospital room?: A Little Help needed climbing 3-5 steps with a railing? : A Little 6 Click Score: 19    End of Session Equipment Utilized During Treatment: Gait belt Activity Tolerance: Patient tolerated treatment well Patient left: in chair;with call bell/phone within reach;with chair alarm set Nurse Communication: Mobility status PT Visit Diagnosis: Unsteadiness on feet (R26.81);Difficulty in walking, not elsewhere classified (R26.2)    Time: 7106-2694 PT Time Calculation (min) (ACUTE ONLY): 25 min   Charges:   PT Evaluation $PT Eval Low Complexity: 1 Low PT Treatments $Gait Training: 8-22 mins        Wynn Maudlin, DPT Acute Rehabilitation Services Office [phone] Pager [phone]   Anitra Lauth 07/18/2021, 12:59 PM

## 2021-07-18 NOTE — Progress Notes (Signed)
Discharge instructions reviewed with pt utilizing the interpreter iPad.  Interpreter ID [id number] assisted.  Pt verbalized understanding of instructions.  Explained to pt importance of following up with cardiology upon discharge. Janari Gagner, Yancey Flemings, RN

## 2021-07-18 NOTE — Discharge Summary (Signed)
Physician Discharge Summary  Alexis Sloan ZOX:[id number] DOB: 11/08/1956 DOA: 07/15/2021  PCP: Pcp, No  Admit date: 07/15/2021 Discharge date: 07/18/2021  Admitted From: home Disposition:  home  Recommendations for Outpatient Follow-up:  Follow up with PCP or cardiology Dr Rosemary HolmsPatwardhan nex week. Home Health:no  Equipment/Devices: none  Discharge Condition: Stable Code Status:   Code Status: Full Code Diet recommendation:  Diet Order             Diet general           Diet regular Room service appropriate? Yes; Fluid consistency: Thin  Diet effective now                    Brief/Interim Summary: [age] old female with history of hypertension brought to the ED on 7/18 with altered mental status.  She was discharged from ED approximately 1 hour prior to admission initially presented after mistakenly consuming THC Gummies 35 mg delta 8, Poison control was contacted and advised observation x4 hours then discharged home with baseline status but an hour after discharge was noted to be pale diaphoretic and had syncopal episode without head injury In the ED bradycardic hypothermic UDS positive for THC CT head no acute finding Patient was was unable to protect airway so was intubated, she was subsequently extubated Patient was transferred to medical floor 7/20 and being monitored due to bradycardia. Patient was seen by cardiologist, no further workup. Patient worked with physical therapy this morning did well.  Overnight asymptomatic did have HR 32. Rn reported pasue 3 to 4-s but during sleep suspecting undiagnosed OSA and will need PCP follow-up.  Discussed with cardiology this morning and okay for discharge home.   Discharge Diagnoses:  Toxic metabolic encephalopathy secondary to WJX:[id number]:resolved. Monitor.  Poison control was contacted by ED.  CT head no acute finding assessment off sedation extubated doing well neurologically at this time Mental status at baseline   Acute  hypoxic respiratory failure due to above-was intubated and has been extubated doing well on room air.  Ambulating well   Sinus bradycardia/pause suspecting due to effect of THC, seen by cardiology question underlying OSA, need PCP follow-up.  Discharge cardiac this morning went over telemetry from overnight - he advised  outpatient follow-up, he will arrange for monitor as outpatient and will need sleep apnea evaluation, patient was instructed, and okay to discharge . Tsh stable.   History of hypertension Bp stable.   Leukocytosis-resolved   Morbid obesity BMI 32.  Will benefit with weight loss.  She will need to have outpatient sleep apnea evaluation  Consults: Cardiology.  Subjective: Alert awake oriented x3 this morning ambulated with the physical therapist.  No symptoms.  Wants to go home.  Family at the bedside.  Discharge Exam: Vitals:   07/17/21 2100 07/18/21 0430  BP:  127/70  Pulse:  (!) 52  Resp: 15 14  Temp:  97.9 F (36.6 C)  SpO2:  97%   General: Pt is alert, awake, not in acute distress Cardiovascular: RRR, S1/S2 +, no rubs, no gallops Respiratory: CTA bilaterally, no wheezing, no rhonchi Abdominal: Soft, NT, ND, bowel sounds + Extremities: no edema, no cyanosis  Discharge Instructions  Discharge Instructions     Diet general   Complete by: As directed    Discharge instructions   Complete by: As directed    Please see the cardiology or pcp next week. Make sure someone is there with you all th time for next 2 days.  You will need to have sleep apnea evaluation done as outpatient.  Please call call MD or return to ER for similar or worsening recurring problem that brought you to hospital or if any fever,nausea/vomiting,abdominal pain, uncontrolled pain, chest pain,  shortness of breath or any other alarming symptoms.  Please follow-up your doctor as instructed in a week time and call the office for appointment.  Please avoid alcohol, smoking, or any other  illicit substance and maintain healthy habits including taking your regular medications as prescribed.  You were cared for by a hospitalist during your hospital stay. If you have any questions about your discharge medications or the care you received while you were in the hospital after you are discharged, you can call the unit and ask to speak with the hospitalist on call if the hospitalist that took care of you is not available.  Once you are discharged, your primary care physician will handle any further medical issues. Please note that NO REFILLS for any discharge medications will be authorized once you are discharged, as it is imperative that you return to your primary care physician (or establish a relationship with a primary care physician if you do not have one) for your aftercare needs so that they can reassess your need for medications and monitor your lab values   Increase activity slowly   Complete by: As directed       Allergies as of 07/18/2021   No Known Allergies      Medication List    You have not been prescribed any medications.     Follow-up Information     Patwardhan, Anabel Bene, MD Follow up.   Specialties: Cardiology, Radiology Why: Will make appt Contact information: [address] [place] [address] [phone]                No Known Allergies  The results of significant diagnostics from this hospitalization (including imaging, microbiology, ancillary and laboratory) are listed below for reference.    Microbiology: Recent Results (from the past 240 hour(s))  SARS CORONAVIRUS 2 (TAT 6-24 HRS) Nasopharyngeal Nasopharyngeal Swab     Status: None   Collection Time: 07/15/21  5:50 AM   Specimen: Nasopharyngeal Swab  Result Value Ref Range Status   SARS Coronavirus 2 NEGATIVE NEGATIVE Final    Comment: (NOTE) SARS-CoV-2 target nucleic acids are NOT DETECTED.  The SARS-CoV-2 RNA is generally detectable in upper and  lower respiratory specimens during the acute phase of infection. Negative results do not preclude SARS-CoV-2 infection, do not rule out co-infections with other pathogens, and should not be used as the sole basis for treatment or other patient management decisions. Negative results must be combined with clinical observations, patient history, and epidemiological information. The expected result is Negative.  Fact Sheet for Patients: HairSlick.no  Fact Sheet for Healthcare Providers: quierodirigir.com  This test is not yet approved or cleared by the [place] FDA and  has been authorized for detection and/or diagnosis of SARS-CoV-2 by FDA under an Emergency Use Authorization (EUA). This EUA will remain  in effect (meaning this test can be used) for the duration of the COVID-19 declaration under Se ction 564(b)(1) of the Act, 21 U.S.C. section 360bbb-3(b)(1), unless the authorization is terminated or revoked sooner.  Performed at [facility], [address] [place], [place] [address]   Blood culture (routine x 2)     Status: None (Preliminary result)   Collection Time: 07/15/21  6:10 AM  Specimen: BLOOD  Result Value Ref Range Status   Specimen Description   Final    BLOOD BLOOD LEFT FOREARM Performed at [facility], [address], [place], [place] [address]    Special Requests   Final    BOTTLES DRAWN AEROBIC AND ANAEROBIC Blood Culture adequate volume Performed at [facility], [address] [place], [place] [address]    Culture   Final    NO GROWTH 2 DAYS Performed at [facility], [address] [place], [place] [address]    Report Status PENDING  Incomplete  Blood culture (routine x 2)     Status: None (Preliminary result)   Collection Time: 07/15/21  6:29 AM   Specimen: BLOOD  Result Value Ref Range Status   Specimen Description   Final    BLOOD BLOOD  RIGHT HAND Performed at [facility], [address], [place], [place] [address]    Special Requests   Final    BOTTLES DRAWN AEROBIC ONLY Blood Culture results may not be optimal due to an excessive volume of blood received in culture bottles Performed at [facility], [address], [place], [place] [address]    Culture   Final    NO GROWTH 2 DAYS Performed at [facility], [address] [place], [place] [address]    Report Status PENDING  Incomplete  MRSA Next Gen by PCR, Nasal     Status: None   Collection Time: 07/15/21  7:33 AM   Specimen: Nasal Mucosa; Nasal Swab  Result Value Ref Range Status   MRSA by PCR Next Gen NOT DETECTED NOT DETECTED Final    Comment: (NOTE) The GeneXpert MRSA Assay (FDA approved for NASAL specimens only), is one component of a comprehensive MRSA colonization surveillance program. It is not intended to diagnose MRSA infection nor to guide or monitor treatment for MRSA infections. Test performance is not FDA approved in patients less than 69 years old. Performed at [facility], [address], [place], [place] [address]     Procedures/Studies: CT Head Wo Contrast  Result Date: 07/15/2021 CLINICAL DATA:  Altered mental status. EXAM: CT HEAD WITHOUT CONTRAST TECHNIQUE: Contiguous axial images were obtained from the base of the skull through the vertex without intravenous contrast. COMPARISON:  No comparison studies available. FINDINGS: Brain: There is no evidence for acute hemorrhage, hydrocephalus, mass lesion, or abnormal extra-axial fluid collection. No definite CT evidence for acute infarction. Vascular: No hyperdense vessel or unexpected calcification. Skull: No evidence for fracture. No worrisome lytic or sclerotic lesion. Sinuses/Orbits: The visualized paranasal sinuses and mastoid air cells are clear. Visualized portions of the globes and intraorbital fat are unremarkable. Other:  None. IMPRESSION: No acute intracranial abnormality. Electronically Signed   By: Kennith Center M.D.   On: 07/15/2021 05:36   DG Chest Port 1 View  Result Date: 07/16/2021 CLINICAL DATA:  Intubation. EXAM: PORTABLE CHEST 1 VIEW COMPARISON:  07/15/2021. FINDINGS: Interim extubation and removal of NG tube. Heart size normal. Low lung volumes with mild bibasilar atelectasis. Mild bibasilar infiltrates cannot be excluded. Tiny left pleural effusion cannot be excluded. No pneumothorax. IMPRESSION: 1.  Interim extubation and removal of NG tube. 2. Low lung volumes with mild bibasilar atelectasis. Mild bibasilar infiltrates cannot be excluded. Tiny left pleural effusion cannot be excluded. Electronically Signed   By: Maisie Fus  Register   On: 07/16/2021 05:57   DG Chest Portable 1 View  Result  Date: 07/15/2021 CLINICAL DATA:  Altered mental status. EXAM: PORTABLE CHEST 1 VIEW COMPARISON:  None. FINDINGS: 0446 hours. Endotracheal tube tip is 3.2 cm above the base of the carina. NG tube tip is in the gastric fundus. Low lung volumes with streaky opacity at the bases consistent with atelectasis. Cardiopericardial silhouette is at upper limits of normal for size. The visualized bony structures of the thorax show no acute abnormality. Telemetry leads overlie the chest. IMPRESSION: 1. Endotracheal tube and NG tube as described. 2. Low lung volumes with bibasilar atelectasis. Electronically Signed   By: Kennith Center M.D.   On: 07/15/2021 05:12   DG Abd Portable 1 View  Result Date: 07/15/2021 CLINICAL DATA:  OG tube placement. EXAM: PORTABLE ABDOMEN - 1 VIEW COMPARISON:  None. FINDINGS: OG tube tip is positioned in the fundus of the stomach with the proximal side port of the tube well below the GE junction. Paucity of bowel gas noted in the visualized upper abdomen. IMPRESSION: OG tube tip is in the stomach. Electronically Signed   By: Kennith Center M.D.   On: 07/15/2021 05:12    Labs: BNP (last 3 results) No results  for input(s): BNP in the last 8760 hours. Basic Metabolic Panel: Recent Labs  Lab 07/15/21 0418 07/16/21 0234 07/17/21 0452  NA 138 140 140  K 3.9 3.5 4.1  CL 106 110 107  CO2 19* 23 25  GLUCOSE 196* 108* 105*  BUN CREATININE 0.70 0.51 0.71  CALCIUM 9.1 8.5* 8.9  MG  --  2.1 2.0  PHOS  --  3.0 3.5   Liver Function Tests: Recent Labs  Lab 07/15/21 0418  AST 24  ALT 29  ALKPHOS 54  BILITOT 0.5  PROT 7.7  ALBUMIN 4.2   No results for input(s): LIPASE, AMYLASE in the last 168 hours. Recent Labs  Lab 07/15/21 0610  AMMONIA 26   CBC: Recent Labs  Lab 07/15/21 0418 07/16/21 0234 07/17/21 0452  WBC 12.1* 10.0 7.0  NEUTROABS 7.1  --   --   HGB 13.8 12.2 13.0  HCT 41.9 37.2 38.0  MCV 93.3 94.7 90.9  PLT 241 189 PLATELET CLUMPS NOTED ON SMEAR, UNABLE TO ESTIMATE   Cardiac Enzymes: Recent Labs  Lab 07/15/21 0418  CKTOTAL 115   BNP: Invalid input(s): POCBNP CBG: Recent Labs  Lab 07/16/21 0012 07/16/21 0444 07/16/21 0731 07/16/21 1246 07/16/21 1626  GLUCAP 111* 100* 93 98 113*   D-Dimer No results for input(s): DDIMER in the last 72 hours. Hgb A1c Recent Labs    07/15/21 2032  HGBA1C 6.1*   Lipid Profile No results for input(s): CHOL, HDL, LDLCALC, TRIG, CHOLHDL, LDLDIRECT in the last 72 hours. Thyroid function studies No results for input(s): TSH, T4TOTAL, T3FREE, THYROIDAB in the last 72 hours.  Invalid input(s): FREET3 Anemia work up No results for input(s): VITAMINB12, FOLATE, FERRITIN, TIBC, IRON, RETICCTPCT in the last 72 hours. Urinalysis    Component Value Date/Time   COLORURINE YELLOW 07/15/2021 0509   APPEARANCEUR HAZY (A) 07/15/2021 0509   LABSPEC 1.023 07/15/2021 0509   PHURINE 5.0 07/15/2021 0509   GLUCOSEU NEGATIVE 07/15/2021 0509   HGBUR NEGATIVE 07/15/2021 0509   BILIRUBINUR NEGATIVE 07/15/2021 0509   KETONESUR NEGATIVE 07/15/2021 0509   PROTEINUR NEGATIVE 07/15/2021 0509   NITRITE NEGATIVE 07/15/2021 0509    LEUKOCYTESUR NEGATIVE 07/15/2021 0509   Sepsis Labs Invalid input(s): PROCALCITONIN,  WBC,  LACTICIDVEN Microbiology Recent Results (from the past 240 hour(s))  SARS  CORONAVIRUS 2 (TAT 6-24 HRS) Nasopharyngeal Nasopharyngeal Swab     Status: None   Collection Time: 07/15/21  5:50 AM   Specimen: Nasopharyngeal Swab  Result Value Ref Range Status   SARS Coronavirus 2 NEGATIVE NEGATIVE Final    Comment: (NOTE) SARS-CoV-2 target nucleic acids are NOT DETECTED.  The SARS-CoV-2 RNA is generally detectable in upper and lower respiratory specimens during the acute phase of infection. Negative results do not preclude SARS-CoV-2 infection, do not rule out co-infections with other pathogens, and should not be used as the sole basis for treatment or other patient management decisions. Negative results must be combined with clinical observations, patient history, and epidemiological information. The expected result is Negative.  Fact Sheet for Patients: HairSlick.no  Fact Sheet for Healthcare Providers: quierodirigir.com  This test is not yet approved or cleared by the [place] FDA and  has been authorized for detection and/or diagnosis of SARS-CoV-2 by FDA under an Emergency Use Authorization (EUA). This EUA will remain  in effect (meaning this test can be used) for the duration of the COVID-19 declaration under Se ction 564(b)(1) of the Act, 21 U.S.C. section 360bbb-3(b)(1), unless the authorization is terminated or revoked sooner.  Performed at [facility], [address] [place], [place] [address]   Blood culture (routine x 2)     Status: None (Preliminary result)   Collection Time: 07/15/21  6:10 AM   Specimen: BLOOD  Result Value Ref Range Status   Specimen Description   Final    BLOOD BLOOD LEFT FOREARM Performed at [facility], [address] [place], [place] [address]    Special Requests    Final    BOTTLES DRAWN AEROBIC AND ANAEROBIC Blood Culture adequate volume Performed at [facility], [address] [place], [place] [address]    Culture   Final    NO GROWTH 2 DAYS Performed at [facility], [address], [place], [place] [address]    Report Status PENDING  Incomplete  Blood culture (routine x 2)     Status: None (Preliminary result)   Collection Time: 07/15/21  6:29 AM   Specimen: BLOOD  Result Value Ref Range Status   Specimen Description   Final    BLOOD BLOOD RIGHT HAND Performed at [facility], [address] [place], [place] [address]    Special Requests   Final    BOTTLES DRAWN AEROBIC ONLY Blood Culture results may not be optimal due to an excessive volume of blood received in culture bottles Performed at [facility], [address] [place], [place] [address]    Culture   Final    NO GROWTH 2 DAYS Performed at [facility], [address] [place], [place] [address]    Report Status PENDING  Incomplete  MRSA Next Gen by PCR, Nasal     Status: None   Collection Time: 07/15/21  7:33 AM   Specimen: Nasal Mucosa; Nasal Swab  Result Value Ref Range Status   MRSA by PCR Next Gen NOT DETECTED NOT DETECTED Final    Comment: (NOTE) The GeneXpert MRSA Assay (FDA approved for NASAL specimens only), is one component of a comprehensive MRSA colonization surveillance program. It is not intended to diagnose MRSA infection nor to guide or monitor treatment for MRSA infections. Test performance is not FDA approved in patients less than 42 years old. Performed at [facility], [address]  [place], [place] [address]      Time coordinating discharge: 25 minutes  SIGNED: Lanae Boast, MD  [facility] 07/18/2021, 10:54 AM  If 7PM-7AM, please contact night-coverage [email or web address]

## 2021-07-18 NOTE — Evaluation (Signed)
Occupational Therapy Evaluation/Discharge Patient Details Name: Alexis Sloan MRN: [id number] DOB: Oct 15, 1956 Today's Date: 07/18/2021    History of Present Illness 71 old female brought to the ED on 7/18 with altered mental status.  She was discharged from ED approximately 1 hour prior to admission initially presented after mistakenly consuming THC Gummies 35 mg delta 8, Poison control was contacted and advised observation x4 hours then discharged home with baseline status but an hour after discharge was noted to be pale diaphoretic and had syncopal episode without head injury. PMH: HTN   Clinical Impression   PTA, pt lives with family and reports Independence in all daily tasks without use of AD. Pt enjoys cooking. Pt presents now feeling better than initial admission and close to baseline. Pt A&Ox4. Pt able to demo ADLs/transfers in room Independently without safety concerns. No further skilled OT services needed at acute level or DC.     Follow Up Recommendations  No OT follow up    Equipment Recommendations  None recommended by OT    Recommendations for Other Services       Precautions / Restrictions Precautions Precautions: Fall Precaution Comments: low fall Restrictions Weight Bearing Restrictions: No      Mobility Bed Mobility Overal bed mobility: Modified Independent             General bed mobility comments: HOB elevated    Transfers Overall transfer level: Independent Equipment used: None Transfers: Sit to/from Stand Sit to Stand: Independent              Balance Overall balance assessment: Mild deficits observed, not formally tested                                         ADL either performed or assessed with clinical judgement   ADL Overall ADL's : Independent                                       General ADL Comments: able to dress self, mobilize in room Independently. no use of AD, no LOB or  safety concerns     Vision Baseline Vision/History: Wears glasses Wears Glasses: Reading only Patient Visual Report: No change from baseline Vision Assessment?: No apparent visual deficits     Perception     Praxis      Pertinent Vitals/Pain Pain Assessment: No/denies pain     Hand Dominance Right   Extremity/Trunk Assessment Upper Extremity Assessment Upper Extremity Assessment: Overall WFL for tasks assessed   Lower Extremity Assessment Lower Extremity Assessment: Defer to PT evaluation   Cervical / Trunk Assessment Cervical / Trunk Assessment: Normal   Communication Communication Communication: Prefers language other than [place];Interpreter utilized   Cognition Arousal/Alertness: Awake/alert Behavior During Therapy: WFL for tasks assessed/performed Overall Cognitive Status: Within Functional Limits for tasks assessed                                     General Comments       Exercises     Shoulder Instructions      Home Living Family/patient expects to be discharged to:: Private residence Living Arrangements: Children;Other relatives (grandchildren) Available Help at Discharge: Family Type of Home: House  Home Layout: One level     Bathroom Shower/Tub: Chief Strategy Officer: Standard     Home Equipment: None          Prior Functioning/Environment Level of Independence: Independent        Comments: Independent with ADLs, IADLs and mobility. Enjoys cookiing        OT Problem List:        OT Treatment/Interventions:      OT Goals(Current goals can be found in the care plan section) Acute Rehab OT Goals Patient Stated Goal: get back home OT Goal Formulation: All assessment and education complete, DC therapy  OT Frequency:     Barriers to D/C:            Co-evaluation              AM-PAC OT "6 Clicks" Daily Activity     Outcome Measure Help from another person eating meals?: None Help  from another person taking care of personal grooming?: None Help from another person toileting, which includes using toliet, bedpan, or urinal?: None Help from another person bathing (including washing, rinsing, drying)?: None Help from another person to put on and taking off regular upper body clothing?: None Help from another person to put on and taking off regular lower body clothing?: None 6 Click Score: 24   End of Session Nurse Communication: Mobility status  Activity Tolerance: Patient tolerated treatment well Patient left: in chair;with call bell/phone within reach  OT Visit Diagnosis: Unsteadiness on feet (R26.81)                Time: 3976-7341 OT Time Calculation (min): 14 min Charges:  OT General Charges $OT Visit: 1 Visit OT Evaluation $OT Eval Low Complexity: 1 Low  Bradd Canary, OTR/L Acute Rehab Services Office: [phone]   Lorre Munroe 07/18/2021, 12:12 PM

## 2021-07-19 ENCOUNTER — Telehealth: Payer: Self-pay

## 2021-07-19 NOTE — Telephone Encounter (Signed)
done

## 2021-07-20 LAB — CULTURE, BLOOD (ROUTINE X 2)
Culture: NO GROWTH
Culture: NO GROWTH
Special Requests: ADEQUATE

## 2021-07-26 ENCOUNTER — Other Ambulatory Visit: Payer: Self-pay

## 2021-07-26 ENCOUNTER — Encounter: Payer: Self-pay | Admitting: Cardiology

## 2021-07-26 ENCOUNTER — Ambulatory Visit: Payer: Self-pay | Admitting: Cardiology

## 2021-07-26 ENCOUNTER — Inpatient Hospital Stay: Payer: Self-pay

## 2021-07-26 VITALS — BP 145/86 | HR 70 | Temp 98.4°F | Resp 16 | Ht 60.0 in | Wt 156.0 lb

## 2021-07-26 DIAGNOSIS — R001 Bradycardia, unspecified: Secondary | ICD-10-CM

## 2021-07-26 DIAGNOSIS — E782 Mixed hyperlipidemia: Secondary | ICD-10-CM

## 2021-07-26 DIAGNOSIS — E785 Hyperlipidemia, unspecified: Secondary | ICD-10-CM | POA: Insufficient documentation

## 2021-07-26 NOTE — Progress Notes (Signed)
   Follow up visit  Subjective:   Alexis Sloan, female    DOB: 1956-03-27, 65 y.o.   MRN: [id number]     HPI  Chief Complaint  Patient presents with   Bradycardia   Hospitalization Follow-up    65 year old Hispanic female with recent bradycardia due to accidental THC overdose, atypical chest pain  Patient is here today with her son Domingo Pulse, who interprets for her.  Patient has been fatigued since her hospital discharge, but getting better.  She denies any chest pain.  She reports lightheadedness especially with postural change.  She also had some lightheadedness while climbing stairs yesterday.  Denies any syncope episodes.  She previously was being seen by a PCP in [place], [place].  She reportedly was on lipid-lowering agents then, but has ran out.  No current outpatient medications on file prior to visit.   No current facility-administered medications on file prior to visit.    Cardiovascular & other pertient studies:  EKG 07/26/2021: Sinus rhythm 74 bpm Normal EKG  Recent labs: 07/17/2021: Glucose 105, BUN/Cr 15/0.71. EGFR >60. Na/K 140/4.1.  H/H 13/38. MCV 90. Platelets ??? HbA1C 6.1% TSH 0.1 normal    Review of Systems  Cardiovascular:  Negative for chest pain, dyspnea on exertion, leg swelling, palpitations and syncope.  Neurological:  Positive for light-headedness.        Vitals:   07/26/21 0828  BP: (!) 145/86  Pulse: 70  Resp: 16  Temp: 98.4 F (36.9 C)  SpO2: 98%   Orthostatic VS for the past 72 hrs (Last 3 readings):  Orthostatic BP Patient Position BP Location Cuff Size Orthostatic Pulse  07/26/21 0942 121/79 Standing Left Arm Normal 78  07/26/21 0941 130/83 Sitting Left Arm Normal 67  07/26/21 0940 132/81 Supine Left Arm Normal 66     Body mass index is 30.47 kg/m. Filed Weights   07/26/21 0828  Weight: 156 lb (70.8 kg)     Objective:   Physical Exam Vitals and nursing note reviewed.  Constitutional:       General: She is not in acute distress. Neck:     Vascular: No JVD.  Cardiovascular:     Rate and Rhythm: Normal rate and regular rhythm.     Heart sounds: Normal heart sounds. No murmur heard. Pulmonary:     Effort: Pulmonary effort is normal.     Breath sounds: Normal breath sounds. No wheezing or rales.  Musculoskeletal:     Right lower leg: No edema.     Left lower leg: No edema.          Assessment & Recommendations:    65 year old Hispanic female with recent bradycardia due to accidental THC overdose, atypical chest pain  Lightheadedness: Possibly due to orthostatic hypotension, cannot exclude bradyarrhythmia. Recommend 2 week cardiac telemetry.  Recommend liberal hydration  Chest pain: Resolved  Mixed hyperlipidemia: Prior history. Check lipid panel   F/u in 4 weeks    Nigel Mormon, MD Pager: [phone] Office: [phone]

## 2021-08-22 NOTE — Progress Notes (Signed)
Follow up visit  Subjective:   Alexis Sloan, female    DOB: January 31, 1956, 65 y.o.   MRN: [id number]     HPI  Chief Complaint  Patient presents with   Bradycardia   Follow-up   Dizziness   Chest Pain    65 y.o. Hispanic female with recent bradycardia due to accidental THC overdose.  History includes hypertension and obesity.  Patient is here today with her son Domingo Pulse, who interprets for her.  Patient presents for 4-week follow-up of lightheadedness and hyperlipidemia.  At last office visit ordered cardiac monitor, recommended liberal hydration and lipid profile testing.  Cardiac monitor revealed no bradycardia arrhythmias.  Patient reports her sternum remains tender to palpation, otherwise she has had no recurrence of chest pain.  She has been doing cardio workout videos 30 minutes/day every day.  She does continue to have occasional episodes of dizziness lasting about 1 minute, particularly while climbing stairs.  Denies syncope, near syncope.  Denies chest pain, palpitations, orthopnea, PND.  Unfortunately lipid profile testing has not been done.  She was previously on lipid-lowering agent, however she cannot remember the name of her medication.  No current outpatient medications on file prior to visit.   No current facility-administered medications on file prior to visit.    Cardiovascular & other pertient studies: EKG 08/26/2021: Sinus rhythm at a rate of 69 bpm.  Left axis.  Incomplete right bundle branch block.  Nonspecific T wave abnormality.  No evidence of ischemia or underlying injury pattern.  EKG 07/26/2021: Sinus rhythm 74 bpm Normal EKG  Recent labs:  07/17/2021: Glucose 105, BUN/Cr 15/0.71. EGFR >60. Na/K 140/4.1.  H/H 13/38. MCV 90. Platelets ??? HbA1C 6.1% TSH 0.1 normal    Review of Systems  Constitutional: Negative for malaise/fatigue and weight gain.  Cardiovascular:  Positive for chest pain (sternum tender to palpation). Negative for  claudication, dyspnea on exertion, leg swelling, near-syncope, orthopnea, palpitations, paroxysmal nocturnal dyspnea and syncope.  Respiratory:  Negative for shortness of breath.   Neurological:  Positive for light-headedness. Negative for dizziness.        Vitals:   08/26/21 0932 08/26/21 0939  BP:    Pulse:    Resp:    Temp:    SpO2: 94% 96%   Orthostatic VS for the past 72 hrs (Last 3 readings):  Orthostatic BP Patient Position BP Location Cuff Size Orthostatic Pulse  08/26/21 0940 137/86 Standing Left Arm Large --  08/26/21 0939 133/85 Sitting Left Arm Large 73  08/26/21 0932 144/87 Supine Left Arm Large 69  08/26/21 0931 -- Sitting Right Arm Normal --     Body mass index is 29.8 kg/m. Filed Weights   08/26/21 0931  Weight: 152 lb 9.6 oz (69.2 kg)     Objective:   Physical Exam Vitals and nursing note reviewed.  Constitutional:      General: She is not in acute distress. HENT:     Head: Normocephalic and atraumatic.  Neck:     Vascular: No JVD.  Cardiovascular:     Rate and Rhythm: Normal rate and regular rhythm.     Pulses: Intact distal pulses.     Heart sounds: Normal heart sounds, S1 normal and S2 normal. No murmur heard.   No gallop.  Pulmonary:     Effort: Pulmonary effort is normal. No respiratory distress.     Breath sounds: Normal breath sounds. No wheezing, rhonchi or rales.  Chest:    Musculoskeletal:  Right lower leg: No edema.     Left lower leg: No edema.  Neurological:     Mental Status: She is alert.    Orthostatic VS for the past 72 hrs (Last 3 readings):  Orthostatic BP Patient Position BP Location Cuff Size Orthostatic Pulse  08/26/21 0940 137/86 Standing Left Arm Large --  08/26/21 0939 133/85 Sitting Left Arm Large 73  08/26/21 0932 144/87 Supine Left Arm Large 69  08/26/21 0931 -- Sitting Right Arm Normal --      Assessment & Recommendations:   65 y.o.Hispanic female with recent bradycardia due to accidental THC  overdose.  History includes hypertension and obesity.  Lightheadedness: Reviewed and discussed results of cardiac monitor, details above.  No bradycardic arrhythmias noted. Patient's symptoms have improved mildly with liberal hydration, however she is still only drinking 3 to 4 glasses of water per day.  Encouraged increased hydration. Patient is not orthostatic in the office today.  Chest pain: Patient's symptoms and physical exam are consistent with musculoskeletal etiology of chest pain. EKG without evidence of ischemia or underlying injury pattern. Will continue to monitor.  Mixed hyperlipidemia: Will obtain lipid profile testing.  Hypertension: Blood pressure remains uncontrolled in the office as well as on home monitoring. Add amlodipine 2.5 mg daily   Follow-up in 6 weeks, sooner if needed, for hypertension and hyperlipidemia.   Alethia Berthold, PA-C 08/26/2021, 10:37 AM Office: [phone]

## 2021-08-26 ENCOUNTER — Encounter: Payer: Self-pay | Admitting: Student

## 2021-08-26 ENCOUNTER — Other Ambulatory Visit: Payer: Self-pay

## 2021-08-26 ENCOUNTER — Ambulatory Visit: Payer: Self-pay | Admitting: Student

## 2021-08-26 VITALS — BP 150/81 | HR 71 | Temp 98.2°F | Resp 16 | Ht 60.0 in | Wt 152.6 lb

## 2021-08-26 DIAGNOSIS — R001 Bradycardia, unspecified: Secondary | ICD-10-CM

## 2021-08-26 DIAGNOSIS — E782 Mixed hyperlipidemia: Secondary | ICD-10-CM

## 2021-08-26 DIAGNOSIS — R42 Dizziness and giddiness: Secondary | ICD-10-CM

## 2021-08-26 MED ORDER — AMLODIPINE BESYLATE 2.5 MG PO TABS: 2.5000 mg | ORAL_TABLET | Freq: Every day | ORAL | 3 refills | Status: DC

## 2021-08-28 NOTE — Progress Notes (Signed)
Called patient, NA, LMAM

## 2021-08-30 NOTE — Progress Notes (Signed)
Called pt no answer, left a vm

## 2021-09-03 NOTE — Progress Notes (Signed)
Called pt no answer, left a vm to return the call back

## 2021-10-07 ENCOUNTER — Ambulatory Visit: Payer: Self-pay | Admitting: Student

## 2021-10-07 NOTE — Progress Notes (Deleted)
Follow up visit  Subjective:   Alexis Sloan, female    DOB: 12-May-1956, 65 y.o.   MRN: [id number]     HPI  No chief complaint on file.   65 y.o. Hispanic female with recent bradycardia due to accidental THC overdose.  History includes hypertension and obesity.  Patient is here today with her son Domingo Pulse, who interprets for her.  Patient presents for 6-week follow-up of hypertension and hyperlipidemia.  At last visit added amlodipine 2.5 mg daily.  Also ordered repeat lipid profile testing, which unfortunately has not been done.***  ***  Patient presents for 4-week follow-up of lightheadedness and hyperlipidemia.  At last office visit ordered cardiac monitor, recommended liberal hydration and lipid profile testing.  Cardiac monitor revealed no bradycardia arrhythmias.  Patient reports her sternum remains tender to palpation, otherwise she has had no recurrence of chest pain.  She has been doing cardio workout videos 30 minutes/day every day.  She does continue to have occasional episodes of dizziness lasting about 1 minute, particularly while climbing stairs.  Denies syncope, near syncope.  Denies chest pain, palpitations, orthopnea, PND.  Unfortunately lipid profile testing has not been done.  She was previously on lipid-lowering agent, however she cannot remember the name of her medication.  Current Outpatient Medications on File Prior to Visit  Medication Sig Dispense Refill   amLODipine (NORVASC) 2.5 MG tablet Take 1 tablet (2.5 mg total) by mouth daily. 30 tablet 3   No current facility-administered medications on file prior to visit.    Cardiovascular & other pertient studies: EKG 08/26/2021: Sinus rhythm at a rate of 69 bpm.  Left axis.  Incomplete right bundle branch block.  Nonspecific T wave abnormality.  No evidence of ischemia or underlying injury pattern.  Mobile cardiac telemetry 12 days 07/26/2021 - 08/08/2021: Dominant rhythm: Sinus. HR 49-117 bpm.  Avg HR 69 bpm, in sinus rhythm. 2 episodes of SVT, fastest and longest at 135 bpm for 7 beats <1% isolated SVE, couplet/triplets. 0 episodes of VT. <1% isolated VE, no couplet/triplets. No atrial fibrillation/atrial flutter/VT/high grade AV block, sinus pause >3sec noted. 1 patient triggered event correlated with sinus rhythm  EKG 07/26/2021: Sinus rhythm 74 bpm Normal EKG  Recent labs:  07/17/2021: Glucose 105, BUN/Cr 15/0.71. EGFR >60. Na/K 140/4.1.  H/H 13/38. MCV 90. Platelets ??? HbA1C 6.1% TSH 0.1 normal    Review of Systems  Constitutional: Negative for malaise/fatigue and weight gain.  Cardiovascular:  Positive for chest pain (sternum tender to palpation). Negative for claudication, dyspnea on exertion, leg swelling, near-syncope, orthopnea, palpitations, paroxysmal nocturnal dyspnea and syncope.  Respiratory:  Negative for shortness of breath.   Neurological:  Positive for light-headedness. Negative for dizziness.        There were no vitals filed for this visit.  No data found.    There is no height or weight on file to calculate BMI. There were no vitals filed for this visit.    Objective:   Physical Exam Vitals and nursing note reviewed.  Constitutional:      General: She is not in acute distress. HENT:     Head: Normocephalic and atraumatic.  Neck:     Vascular: No JVD.  Cardiovascular:     Rate and Rhythm: Normal rate and regular rhythm.     Pulses: Intact distal pulses.     Heart sounds: Normal heart sounds, S1 normal and S2 normal. No murmur heard.   No gallop.  Pulmonary:  Effort: Pulmonary effort is normal. No respiratory distress.     Breath sounds: Normal breath sounds. No wheezing, rhonchi or rales.  Chest:    Musculoskeletal:     Right lower leg: No edema.     Left lower leg: No edema.  Neurological:     Mental Status: She is alert.    No data found.     Assessment & Recommendations:   65 y.o.Hispanic female with recent  bradycardia due to accidental THC overdose.  History includes hypertension and obesity.  Lightheadedness: Reviewed and discussed results of cardiac monitor, details above.  No bradycardic arrhythmias noted. Patient's symptoms have improved mildly with liberal hydration, however she is still only drinking 3 to 4 glasses of water per day.  Encouraged increased hydration. Patient is not orthostatic in the office today.  Chest pain: Patient's symptoms and physical exam are consistent with musculoskeletal etiology of chest pain. EKG without evidence of ischemia or underlying injury pattern. Will continue to monitor.  Mixed hyperlipidemia: Will obtain lipid profile testing.  Hypertension: Blood pressure remains uncontrolled in the office as well as on home monitoring. Add amlodipine 2.5 mg daily   Follow-up in 6 weeks, sooner if needed, for hypertension and hyperlipidemia.   Alethia Berthold, PA-C 10/07/2021, 8:12 AM Office: [phone]

## 2022-08-10 IMAGING — DX DG CHEST 1V PORT
1 series · 1 of 1 positions shown · non-contrast
Comparison: 07/15/2021.

CLINICAL DATA: Intubation.

EXAM:
PORTABLE CHEST 1 VIEW

[chest ap]
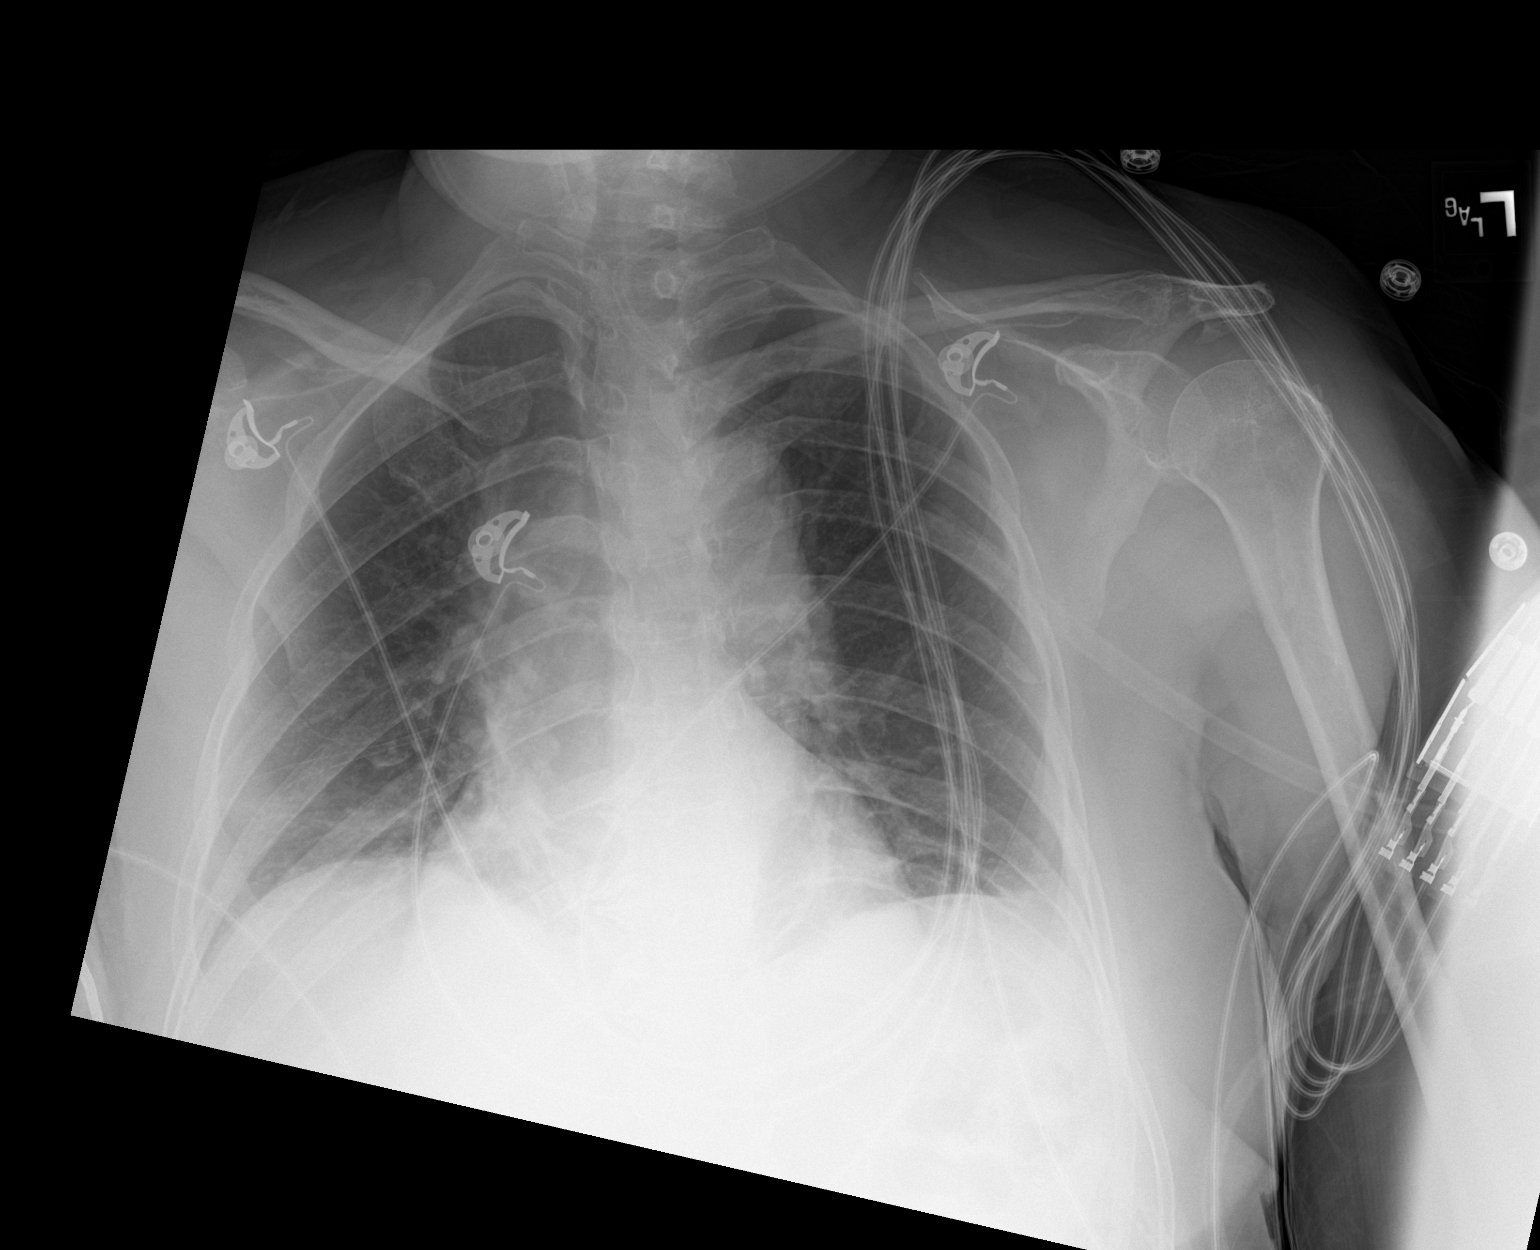

[1 of 1 positions shown; findings below may reference images not displayed]

FINDINGS: Interim extubation and removal of NG tube. Heart size normal. Low
lung volumes with mild bibasilar atelectasis. Mild bibasilar
infiltrates cannot be excluded. Tiny left pleural effusion cannot be
excluded. No pneumothorax.
IMPRESSION: 1.  Interim extubation and removal of NG tube.

2. Low lung volumes with mild bibasilar atelectasis. Mild bibasilar
infiltrates cannot be excluded. Tiny left pleural effusion cannot be
excluded.

## 2023-07-14 ENCOUNTER — Emergency Department (HOSPITAL_COMMUNITY): Payer: Medicaid Other

## 2023-07-14 ENCOUNTER — Other Ambulatory Visit: Payer: Self-pay

## 2023-07-14 ENCOUNTER — Inpatient Hospital Stay (HOSPITAL_COMMUNITY)
Admission: EM | Admit: 2023-07-14 | Discharge: 2023-07-16 | DRG: 641 | Disposition: A | Discharge status: Home / Self Care | Payer: Medicaid Other | Attending: Family Medicine | Admitting: Family Medicine

## 2023-07-14 DIAGNOSIS — H532 Diplopia: Secondary | ICD-10-CM | POA: Diagnosis present

## 2023-07-14 DIAGNOSIS — I10 Essential (primary) hypertension: Secondary | ICD-10-CM | POA: Diagnosis present

## 2023-07-14 DIAGNOSIS — E876 Hypokalemia: Principal | ICD-10-CM | POA: Diagnosis present

## 2023-07-14 DIAGNOSIS — R5382 Chronic fatigue, unspecified: Secondary | ICD-10-CM

## 2023-07-14 DIAGNOSIS — R7303 Prediabetes: Secondary | ICD-10-CM | POA: Diagnosis present

## 2023-07-14 DIAGNOSIS — Z79899 Other long term (current) drug therapy: Secondary | ICD-10-CM

## 2023-07-14 DIAGNOSIS — R531 Weakness: Secondary | ICD-10-CM | POA: Diagnosis present

## 2023-07-14 DIAGNOSIS — E785 Hyperlipidemia, unspecified: Secondary | ICD-10-CM | POA: Diagnosis present

## 2023-07-14 DIAGNOSIS — R42 Dizziness and giddiness: Secondary | ICD-10-CM | POA: Diagnosis present

## 2023-07-14 DIAGNOSIS — E782 Mixed hyperlipidemia: Secondary | ICD-10-CM | POA: Diagnosis present

## 2023-07-14 LAB — BASIC METABOLIC PANEL
Anion gap: 12 (ref 5–15)
BUN: 13 mg/dL (ref 8–23)
CO2: 26 mmol/L (ref 22–32)
Calcium: 9.6 mg/dL (ref 8.9–10.3)
Chloride: 100 mmol/L (ref 98–111)
Creatinine, Ser: 0.74 mg/dL (ref 0.44–1.00)
GFR, Estimated: >60 mL/min (ref >60)
Glucose, Bld: 105 mg/dL — ABNORMAL HIGH (ref 70–99)
Potassium: 2.3 mmol/L — CL (ref 3.5–5.1)
Sodium: 138 mmol/L (ref 135–145)

## 2023-07-14 LAB — CBC WITH DIFFERENTIAL/PLATELET
Abs Immature Granulocytes: 0.02 10*3/uL (ref 0.00–0.07)
Basophils Absolute: 0.1 10*3/uL (ref 0.0–0.1)
Basophils Relative: 1 %
Eosinophils Absolute: 0.1 10*3/uL (ref 0.0–0.5)
Eosinophils Relative: 1 %
HCT: 41.1 % (ref 36.0–46.0)
Hemoglobin: 14.1 g/dL (ref 12.0–15.0)
Immature Granulocytes: 0 %
Lymphocytes Relative: 28 %
Lymphs Abs: 2.7 10*3/uL (ref 0.7–4.0)
MCH: 30 pg (ref 26.0–34.0)
MCHC: 34.3 g/dL (ref 30.0–36.0)
MCV: 87.4 fL (ref 80.0–100.0)
Monocytes Absolute: 0.5 10*3/uL (ref 0.1–1.0)
Monocytes Relative: 5 %
Neutro Abs: 6.5 10*3/uL (ref 1.7–7.7)
Neutrophils Relative %: 65 %
Platelets: 269 10*3/uL (ref 150–400)
RBC: 4.7 MIL/uL (ref 3.87–5.11)
RDW: 12.9 % (ref 11.5–15.5)
WBC: 9.8 10*3/uL (ref 4.0–10.5)
nRBC: 0 % (ref 0.0–0.2)

## 2023-07-14 LAB — HEMOGLOBIN A1C
Hgb A1c MFr Bld: 6.1 % — ABNORMAL HIGH (ref 4.8–5.6)
Mean Plasma Glucose: 128.37 mg/dL

## 2023-07-14 LAB — MAGNESIUM: Magnesium: 2.3 mg/dL (ref 1.7–2.4)

## 2023-07-14 LAB — TSH: TSH: 2.05 u[IU]/mL (ref 0.350–4.500)

## 2023-07-14 LAB — VITAMIN B12: Vitamin B-12: 470 pg/mL (ref 180–914)

## 2023-07-14 MED ORDER — ATORVASTATIN CALCIUM 10 MG PO TABS
10.0000 mg | ORAL_TABLET | Freq: Every day | ORAL | Status: DC
  Administered 2023-07-15 – 2023-07-16 (×2): 10 mg via ORAL
  Filled 2023-07-14 (×2): qty 1

## 2023-07-14 MED ORDER — ACETAMINOPHEN 650 MG RE SUPP: 650.0000 mg | Freq: Four times a day (QID) | RECTAL | Status: DC | PRN

## 2023-07-14 MED ORDER — AMLODIPINE BESYLATE 10 MG PO TABS
10.0000 mg | ORAL_TABLET | Freq: Every day | ORAL | Status: DC
  Filled 2023-07-14: qty 1

## 2023-07-14 MED ORDER — ONDANSETRON HCL 4 MG/2ML IJ SOLN: 4.0000 mg | Freq: Four times a day (QID) | INTRAMUSCULAR | Status: DC | PRN

## 2023-07-14 MED ORDER — ENOXAPARIN SODIUM 40 MG/0.4ML IJ SOSY
40.0000 mg | PREFILLED_SYRINGE | INTRAMUSCULAR | Status: DC
  Administered 2023-07-15 (×2): 40 mg via SUBCUTANEOUS
  Filled 2023-07-14 (×2): qty 0.4

## 2023-07-14 MED ORDER — ONDANSETRON HCL 4 MG PO TABS: 4.0000 mg | ORAL_TABLET | Freq: Four times a day (QID) | ORAL | Status: DC | PRN

## 2023-07-14 MED ORDER — POTASSIUM CHLORIDE 10 MEQ/100ML IV SOLN
10.0000 meq | Freq: Once | INTRAVENOUS | Status: AC
  Administered 2023-07-14: 10 meq via INTRAVENOUS
  Filled 2023-07-14: qty 100

## 2023-07-14 MED ORDER — MECLIZINE HCL 25 MG PO TABS
25.0000 mg | ORAL_TABLET | Freq: Once | ORAL | Status: AC
  Administered 2023-07-14: 25 mg via ORAL
  Filled 2023-07-14: qty 1

## 2023-07-14 MED ORDER — MECLIZINE HCL 25 MG PO TABS: 25.0000 mg | ORAL_TABLET | Freq: Three times a day (TID) | ORAL | Status: DC | PRN

## 2023-07-14 MED ORDER — POTASSIUM CHLORIDE CRYS ER 20 MEQ PO TBCR
60.0000 meq | EXTENDED_RELEASE_TABLET | Freq: Once | ORAL | Status: AC
  Administered 2023-07-14: 60 meq via ORAL
  Filled 2023-07-14: qty 3

## 2023-07-14 MED ORDER — POTASSIUM CHLORIDE CRYS ER 20 MEQ PO TBCR: 40.0000 meq | EXTENDED_RELEASE_TABLET | Freq: Once | ORAL | Status: DC

## 2023-07-14 MED ORDER — ACETAMINOPHEN 325 MG PO TABS
650.0000 mg | ORAL_TABLET | Freq: Four times a day (QID) | ORAL | Status: DC | PRN
  Administered 2023-07-14: 650 mg via ORAL
  Filled 2023-07-14: qty 2

## 2023-07-14 MED ORDER — SODIUM CHLORIDE 0.9 % IV SOLN: INTRAVENOUS | Status: DC

## 2023-07-14 MED ORDER — LISINOPRIL 20 MG PO TABS
20.0000 mg | ORAL_TABLET | Freq: Every day | ORAL | Status: DC
  Administered 2023-07-15 – 2023-07-16 (×2): 20 mg via ORAL
  Filled 2023-07-14 (×2): qty 1

## 2023-07-14 NOTE — Assessment & Plan Note (Signed)
Hx of BPPV diagnosed 2 months ago. This morning her dizziness was the same as her vertigo in the past.  CT head negative  Normal neuro exam  Dizziness resolved  Check orthostatic vitals  Bp not really significantly elevated to make me think HTN urgency and normalized with no intervention  Meclizine prn  Discontinue hydrochlorothiazide

## 2023-07-14 NOTE — ED Notes (Signed)
ED TO INPATIENT HANDOFF REPORT  ED Nurse Name and Phone #: Thamas Jaegers Name/Age/Gender Alexis Sloan 67 y.o. female Room/Bed: WA21/WA21  Code Status   Code Status: Prior  Home/SNF/Other Home Patient oriented to: self, place, time, and situation Is this baseline? Yes   Triage Complete: Triage complete  Chief Complaint Hypokalemia [E87.6]  Triage Note Pt reports dizziness and blurry vision x few months hx hypertension   Allergies No Known Allergies  Level of Care/Admitting Diagnosis ED Disposition     ED Disposition  Admit   Condition  --   Comment  Hospital Area: [facility] [[id number]]  Level of Care: Telemetry [5]  Admit to tele based on following criteria: Other see comments  Comments: hypokalemia  May place patient in observation at [facility] or [facility] if equivalent level of care is available:: Yes  Covid Evaluation: Asymptomatic - no recent exposure (last 10 days) testing not required  Diagnosis: Hypokalemia [161096]  Admitting Physician: Orland Mustard [[id number]]  Attending Physician: Orland Mustard [[id number]]          B Medical/Surgery History Past Medical History:  Diagnosis Date   Hyperlipidemia    Hypertension    Past Surgical History:  Procedure Laterality Date   HYSTEROTOMY       A IV Location/Drains/Wounds Patient Lines/Drains/Airways Status     Active Line/Drains/Airways     Name Placement date Placement time Site Days   Peripheral IV 07/14/23 20 G 1.88" Right Antecubital 07/14/23  1245  Antecubital  less than 1            Intake/Output Last 24 hours  Intake/Output Summary (Last 24 hours) at 07/14/2023 1633 Last data filed at 07/14/2023 1633 Gross per 24 hour  Intake 100 ml  Output --  Net 100 ml    Labs/Imaging Results for orders placed or performed during the hospital encounter of 07/14/23 (from the past 48 hour(s))  CBC with Differential     Status: None   Collection Time: 07/14/23   1:44 PM  Result Value Ref Range   WBC 9.8 4.0 - 10.5 K/uL   RBC 4.70 3.87 - 5.11 MIL/uL   Hemoglobin 14.1 12.0 - 15.0 g/dL   HCT 29.5 62.1 - 30.8 %   MCV 87.4 80.0 - 100.0 fL   MCH 30.0 26.0 - 34.0 pg   MCHC 34.3 30.0 - 36.0 g/dL   RDW 65.7 84.6 - 96.2 %   Platelets 269 150 - 400 K/uL   nRBC 0.0 0.0 - 0.2 %   Neutrophils Relative % 65 %   Neutro Abs 6.5 1.7 - 7.7 K/uL   Lymphocytes Relative 28 %   Lymphs Abs 2.7 0.7 - 4.0 K/uL   Monocytes Relative 5 %   Monocytes Absolute 0.5 0.1 - 1.0 K/uL   Eosinophils Relative 1 %   Eosinophils Absolute 0.1 0.0 - 0.5 K/uL   Basophils Relative 1 %   Basophils Absolute 0.1 0.0 - 0.1 K/uL   Immature Granulocytes 0 %   Abs Immature Granulocytes 0.02 0.00 - 0.07 K/uL    Comment: Performed at [facility], [address] [place], [place] [address]  Basic metabolic panel     Status: Abnormal   Collection Time: 07/14/23  1:44 PM  Result Value Ref Range   Sodium 138 135 - 145 mmol/L   Potassium 2.3 (LL) 3.5 - 5.1 mmol/L    Comment: CRITICAL RESULT CALLED TO, READ BACK BY AND VERIFIED WITH HOLLATE,C  RN @ 1455 ON 07/14/2023 BY ABDULHALIM,M    Chloride 100 98 - 111 mmol/L   CO2 26 22 - 32 mmol/L   Glucose, Bld 105 (H) 70 - 99 mg/dL    Comment: Glucose reference range applies only to samples taken after fasting for at least 8 hours.   BUN 13 8 - 23 mg/dL   Creatinine, Ser 0.86 0.44 - 1.00 mg/dL   Calcium 9.6 8.9 - 57.8 mg/dL   GFR, Estimated >46 >96 mL/min    Comment: (NOTE) Calculated using the CKD-EPI Creatinine Equation (2021)    Anion gap 12 5 - 15    Comment: Performed at [facility], [address] [place], [place] [address]   CT Head Wo Contrast  Result Date: 07/14/2023 CLINICAL DATA:  Dizziness and blurry vision for a few months. History of hypertension. EXAM: CT HEAD WITHOUT CONTRAST TECHNIQUE: Contiguous axial images were obtained from the base of the skull through the vertex without  intravenous contrast. RADIATION DOSE REDUCTION: This exam was performed according to the departmental dose-optimization program which includes automated exposure control, adjustment of the mA and/or kV according to patient size and/or use of iterative reconstruction technique. COMPARISON:  07/15/2021 FINDINGS: Brain: No evidence of acute infarction, hemorrhage, hydrocephalus, extra-axial collection or mass lesion/mass effect. Vascular: No hyperdense vessel or unexpected calcification. Skull: Normal. Negative for fracture or focal lesion. Sinuses/Orbits: No acute finding. IMPRESSION: Normal head CT. Electronically Signed   By: Tiburcio Pea M.D.   On: 07/14/2023 13:45    Pending Labs Unresulted Labs (From admission, onward)     Start     Ordered   07/14/23 1515  Magnesium  Once,   STAT        07/14/23 1514            Vitals/Pain Today's Vitals   07/14/23 1400 07/14/23 1430 07/14/23 1500 07/14/23 1518  BP: 116/68 115/64 120/64   Pulse: 64 61 64   Resp: 13 14 13    Temp:    98.1 F (36.7 C)  TempSrc:    Oral  SpO2: 96% 95% 97%   Weight:      Height:        Isolation Precautions No active isolations  Medications Medications  meclizine (ANTIVERT) tablet 25 mg (25 mg Oral Given 07/14/23 1347)  potassium chloride SA (KLOR-CON M) CR tablet 60 mEq (60 mEq Oral Given 07/14/23 1504)  potassium chloride 10 mEq in 100 mL IVPB (0 mEq Intravenous Stopped 07/14/23 1633)    Mobility walks     Focused Assessments    R Recommendations: See Admitting Provider Note  Report given to:   Additional Notes: Pt speaks Spanish.

## 2023-07-14 NOTE — ED Provider Notes (Signed)
[place] EMERGENCY DEPARTMENT AT [facility] Provider Note   CSN: [id number] Arrival date & time: 07/14/23  1108     History Chief Complaint  Patient presents with   Hypertension     Hypertension   Alexis Sloan is a 67 y.o. female presenting for dizziness.  She presents to the ED due to dizziness, and blurry vision that started this morning. She reports that she gets these symptoms when her BP is elevated. This morning BP was 150/102.  Dizziness is worse with exertion, and head movement.  She denies facial dropping, arm/leg weakness.   Patient's recorded medical, surgical, social, medication list and allergies were reviewed in the Snapshot window as part of the initial history.   Review of Systems   Review of Systems  Physical Exam Updated Vital Signs BP 124/70 (BP Location: Left Arm)   Pulse 65   Temp 98.3 F (36.8 C) (Oral)   Resp 18   Ht 5' (1.524 m)   Wt 70 kg   SpO2 97%   BMI 30.14 kg/m  Physical Exam General: Pleasant, no acute distress.   CV: RRR. No murmurs, rubs, or gallops. Left lower extremity edema Pulmonary: Lungs CTAB. Normal effort. No wheezing or rales. Abdominal: Soft, nontender, nondistended. Normal bowel sounds. Extremities: Palpable pulses. Normal ROM. Skin: Warm and dry. No obvious rash or lesions. Neuro: A&Ox3. Moves all extremities. Normal sensation. No focal deficit. Psych: Normal mood and affect   ED Course/ Medical Decision Making/ A&P    Procedures Procedures   Medications Ordered in ED Medications  enoxaparin (LOVENOX) injection 40 mg (has no administration in time range)  0.9 %  sodium chloride infusion (has no administration in time range)  acetaminophen (TYLENOL) tablet 650 mg (650 mg Oral Given 07/14/23 1814)    Or  acetaminophen (TYLENOL) suppository 650 mg ( Rectal See Alternative 07/14/23 1814)  ondansetron (ZOFRAN) tablet 4 mg (has no administration in time range)    Or  ondansetron (ZOFRAN)  injection 4 mg (has no administration in time range)  amLODipine (NORVASC) tablet 10 mg (has no administration in time range)  atorvastatin (LIPITOR) tablet 10 mg (has no administration in time range)  lisinopril (ZESTRIL) tablet 20 mg (has no administration in time range)  meclizine (ANTIVERT) tablet 25 mg (has no administration in time range)  meclizine (ANTIVERT) tablet 25 mg (25 mg Oral Given 07/14/23 1347)  potassium chloride SA (KLOR-CON M) CR tablet 60 mEq (60 mEq Oral Given 07/14/23 1504)  potassium chloride 10 mEq in 100 mL IVPB (0 mEq Intravenous Stopped 07/14/23 1633)    Medical Decision Making:    Alexis Sloan is a 67 y.o. female who presented to the ED today with dizziness detailed above.     Patient placed on continuous vitals and telemetry monitoring while in ED which was reviewed periodically.   Complete initial physical exam performed, notably the patient  was able.  No focal neurologic deficit on exam.  She reports dizziness with head movement.   Reviewed and confirmed nursing documentation for past medical history, family history, social history.    Initial Assessment:   Differential diagnosis for dizziness includes stroke versus BPPV versus intracranial mass.  Alternatively dizziness can be secondary to cardiac etiology or electrolyte changes.  She will need EKG to exclude a cardiac cause.   She is also complaining of head heaviness and off-balance.  She will need CT of the head without contrast to exclude a stroke.   Initial  Plan:  CT of the head without contrast. Screening labs including CBC and Metabolic panel to evaluate for infectious or metabolic etiology of disease.  Urinalysis with reflex culture ordered to evaluate for UTI or relevant urologic/nephrologic pathology.  EKG to evaluate for cardiac pathology. Objective evaluation as below reviewed with plan for close reassessment  Initial Study Results:   Laboratory  All laboratory results  reviewed without evidence of clinically relevant pathology.    EKG EKG was reviewed independently. Rate, rhythm, axis, intervals all examined and without medically relevant abnormality. ST segments without concerns for elevations.    Radiology  All images reviewed independently. Agree with radiology report at this time.   CT Head Wo Contrast  Result Date: 07/14/2023 CLINICAL DATA:  Dizziness and blurry vision for a few months. History of hypertension. EXAM: CT HEAD WITHOUT CONTRAST TECHNIQUE: Contiguous axial images were obtained from the base of the skull through the vertex without intravenous contrast. RADIATION DOSE REDUCTION: This exam was performed according to the departmental dose-optimization program which includes automated exposure control, adjustment of the mA and/or kV according to patient size and/or use of iterative reconstruction technique. COMPARISON:  07/15/2021 FINDINGS: Brain: No evidence of acute infarction, hemorrhage, hydrocephalus, extra-axial collection or mass lesion/mass effect. Vascular: No hyperdense vessel or unexpected calcification. Skull: Normal. Negative for fracture or focal lesion. Sinuses/Orbits: No acute finding. IMPRESSION: Normal head CT. Electronically Signed   By: Tiburcio Pea M.D.   On: 07/14/2023 13:45      Reassessment and Plan:   CT of the head normal.  EKG shows no ischemic changes. Normal CBC.   Potassium 2.3.  She was treated with 60 mEq of oral potassium, and 10 mg of IV potassium.  She will be admitted and treated for hypokalemia.   clinical Impression:  1. Hypokalemia      Admit   Final Clinical Impression(s) / ED Diagnoses Final diagnoses:  Hypokalemia    Rx / DC Orders ED Discharge Orders     None         Laretta Bolster, MD 07/14/23 [place]    Lorre Nick, MD 07/15/23 1435

## 2023-07-14 NOTE — Assessment & Plan Note (Signed)
 Continue lipitor daily  ?

## 2023-07-14 NOTE — Assessment & Plan Note (Signed)
Blood pressure has been controlled at home on her norvasc 10mg  and hydrochlorothiazide 25mg  until today when 154/104 Potassium low with some dizziness -check orthostatics -discontinue hydrochlorothiazide and start lisinopril 20mg   -continue CCB for now, but has had complaints of leg swelling  -Side effects of ACE-I discussed including dry cough and angioedema.  -will need close f/u with PCP outpatient

## 2023-07-14 NOTE — H&P (Signed)
History and Physical    Patient: Alexis Sloan XLK:[id number] DOB: 1956/10/10 DOA: 07/14/2023 DOS: the patient was seen and examined on 07/14/2023 PCP: Pcp, No  Patient coming from: Home - lives with her son and granddaughter    Chief Complaint: dizziness and high blood pressure   HPI: Alexis Sloan is a 67 y.o. female with medical history significant of HTN and HLD who presented to ED with complaints of dizziness and double vision. She initially had elevated blood pressure that normalized with no intervention. Her daughter in law is in the room and states that she has been struggling with her blood pressures for a few weeks. She has been on the same medication for 6 months. She has been averaging 150/100 for her blood pressures over the past few weeks.  This morning she woke up and she complained of blurry vision, was dizzy and felt nauseated.  They took her blood pressure and it was 154/104. She has been having intermittent symptoms as described above for months, but much worse today. Her home readings have been 120-125 systolic normally, but today it was the highest. No headaches, just dizziness this AM. This has resolved. She has no vision changes.    Denies any fever/chills, headaches, chest pain or palpitations, shortness of breath or cough, abdominal pain, V/D, dysuria or leg swelling.     She does not smoke or drink alcohol.   ER Course:  vitals: afebrile, bp: 161/83>120/64, HR: 71, RR: 17, oxygen: 98%RA Pertinent labs: potassium: 2.3,  CT head: negative In ED: given oral potassium and IV as well as antivert. TRH asked to admit.     Review of Systems: As mentioned in the history of present illness. All other systems reviewed and are negative. Past Medical History:  Diagnosis Date   Hyperlipidemia    Hypertension    Past Surgical History:  Procedure Laterality Date   HYSTEROTOMY     Social History:  reports that she has never smoked.  She has never been exposed to tobacco smoke. She has never used smokeless tobacco. She reports that she does not drink alcohol and does not use drugs.  No Known Allergies  No family history on file.  Prior to Admission medications   Medication Sig Start Date End Date Taking? Authorizing Provider  amLODipine (NORVASC) 10 MG tablet Take 10 mg by mouth daily.   Yes [provider]  atorvastatin (LIPITOR) 10 MG tablet Take 10 mg by mouth daily. 05/10/23  Yes [provider]  hydrochlorothiazide (HYDRODIURIL) 25 MG tablet Take 25 mg by mouth daily. 05/16/23  Yes [provider]    Physical Exam: Vitals:   07/14/23 1600 07/14/23 1630 07/14/23 1700 07/14/23 1758  BP: 120/70 133/74 137/76 124/70  Pulse: 66 77 66 65  Resp: 13 15 18 18   Temp:    98.3 F (36.8 C)  TempSrc:    Oral  SpO2: 96% 95% 98% 97%  Weight:      Height:       General:  Appears calm and comfortable and is in NAD Eyes:  PERRL, EOMI, normal lids, iris ENT:  grossly normal hearing, lips & tongue, mmm; appropriate dentition Neck:  no LAD, masses or thyromegaly; no carotid bruits Cardiovascular:  RRR, no m/r/g. No LE edema.  Respiratory:   CTA bilaterally with no wheezes/rales/rhonchi.  Normal respiratory effort. Abdomen:  soft, NT, ND, NABS Back:   normal alignment, no CVAT Skin:  no rash or induration seen  on limited exam Musculoskeletal:  grossly normal tone BUE/BLE, good ROM, no bony abnormality Lower extremity:  No LE edema.  Limited foot exam with no ulcerations.  2+ distal pulses. Psychiatric:  grossly normal mood and affect, speech fluent and appropriate, AOx3 Neurologic:  CN 2-12 grossly intact, moves all extremities in coordinated fashion, sensation intact   Radiological Exams on Admission: Independently reviewed - see discussion in A/P where applicable  CT Head Wo Contrast  Result Date: 07/14/2023 CLINICAL DATA:  Dizziness and blurry vision for a few months. History of  hypertension. EXAM: CT HEAD WITHOUT CONTRAST TECHNIQUE: Contiguous axial images were obtained from the base of the skull through the vertex without intravenous contrast. RADIATION DOSE REDUCTION: This exam was performed according to the departmental dose-optimization program which includes automated exposure control, adjustment of the mA and/or kV according to patient size and/or use of iterative reconstruction technique. COMPARISON:  07/15/2021 FINDINGS: Brain: No evidence of acute infarction, hemorrhage, hydrocephalus, extra-axial collection or mass lesion/mass effect. Vascular: No hyperdense vessel or unexpected calcification. Skull: Normal. Negative for fracture or focal lesion. Sinuses/Orbits: No acute finding. IMPRESSION: Normal head CT. Electronically Signed   By: Tiburcio Pea M.D.   On: 07/14/2023 13:45    EKG: Independently reviewed.  NSR with rate 69; nonspecific ST changes with no evidence of acute ischemia   Labs on Admission: I have personally reviewed the available labs and imaging studies at the time of the admission.  Pertinent labs:   Potassium: 2.3  Assessment and Plan: Principal Problem:   Hypokalemia Active Problems:   Weakness   Essential hypertension   Dizziness   Mixed hyperlipidemia   Prediabetes    Assessment and Plan: * Hypokalemia 67 year old presenting with acute onset of dizziness/weakness and vision changes with concern for elevated blood pressure found to have potassium of 2.3 -obs to telemetry  -magnesium pending -discontinue hydrochlorothiazide -replete and trend  -no changes on EKG   Weakness Likely due to hypokalemia and states she has not been eating well Family states gait normal at home Check TSH, B12, vitamin D and replete potassium  Magnesium pending    Essential hypertension Blood pressure has been controlled at home on her norvasc 10mg  and hydrochlorothiazide 25mg  until today when 154/104 Potassium low with some dizziness -check  orthostatics -discontinue hydrochlorothiazide and start lisinopril 20mg   -continue CCB for now, but has had complaints of leg swelling  -Side effects of ACE-I discussed including dry cough and angioedema.  -will need close f/u with PCP outpatient   Dizziness Hx of BPPV diagnosed 2 months ago. This morning her dizziness was the same as her vertigo in the past.  CT head negative  Normal neuro exam  Dizziness resolved  Check orthostatic vitals  Bp not really significantly elevated to make me think HTN urgency and normalized with no intervention  Meclizine prn  Discontinue hydrochlorothiazide    Mixed hyperlipidemia Continue lipitor daily   Prediabetes Check A1c     Advance Care Planning:   Code Status: Full Code   Consults: none   DVT Prophylaxis: lovenox   Family Communication: son, granddaughter, daughter in law at bedside   Severity of Illness: The appropriate patient status for this patient is OBSERVATION. Observation status is judged to be reasonable and necessary in order to provide the required intensity of service to ensure the patient's safety. The patient's presenting symptoms, physical exam findings, and initial radiographic and laboratory data in the context of their medical condition is felt to place  them at decreased risk for further clinical deterioration. Furthermore, it is anticipated that the patient will be medically stable for discharge from the hospital within 2 midnights of admission.   Author: Orland Mustard, MD 07/14/2023 7:25 PM  For on call review [email or web address].

## 2023-07-14 NOTE — Assessment & Plan Note (Signed)
67 year old presenting with acute onset of dizziness/weakness and vision changes with concern for elevated blood pressure found to have potassium of 2.3 -obs to telemetry  -magnesium pending -discontinue hydrochlorothiazide -replete and trend  -no changes on EKG

## 2023-07-14 NOTE — ED Triage Notes (Signed)
Pt reports dizziness and blurry vision x few months hx hypertension

## 2023-07-14 NOTE — ED Provider Notes (Addendum)
I saw and evaluated the patient, reviewed the resident's note and I agree with the findings and plan.    ED ECG REPORT   Date: 07/14/2023  Rate: 74  Rhythm: normal sinus rhythm  QRS Axis: normal  Intervals: normal  ST/T Wave abnormalities: normal  Conduction Disutrbances:none  Narrative Interpretation:   Old EKG Reviewed: none available  I have personally reviewed the EKG tracing and agree with the computerized printout as noted.   Six 67-year-old female presents with dizziness that began today.  Some head heaviness.  Does feel off balance when she walks.  No history of trauma.  No emesis.  On exam, patient is slightly off balance when she tries to walk.  No other focal neurological deficits.  Patient's symptoms slightly worse with certain head movements.  Possible vertigo.  Will check head CT and blood work as well as medicate with Antivert and reassess.  Blood pressure here has been stable.  Interpreter used for this exam   3:01 PM Patient has hypokalemia here on her electrolytes.  Patient is EKG shows no evidence of severe QT prolongation.  Head CT per interpretation showed no acute findings.  Suspect that patient is dizziness could be related to hyperkalemia.  Will give oral as well as IV potassium.  Will also add magnesium.  Patient will require admission. Lorre Nick, MD 07/14/23 1307    Lorre Nick, MD 07/14/23 [phone]

## 2023-07-14 NOTE — Assessment & Plan Note (Signed)
Check A1c. 

## 2023-07-14 NOTE — Assessment & Plan Note (Signed)
Likely due to hypokalemia and states she has not been eating well Family states gait normal at home Check TSH, B12, vitamin D and replete potassium  Magnesium pending

## 2023-07-15 ENCOUNTER — Encounter (HOSPITAL_COMMUNITY): Payer: Self-pay | Admitting: Family Medicine

## 2023-07-15 DIAGNOSIS — E876 Hypokalemia: Secondary | ICD-10-CM | POA: Diagnosis present

## 2023-07-15 DIAGNOSIS — R7303 Prediabetes: Secondary | ICD-10-CM | POA: Diagnosis present

## 2023-07-15 DIAGNOSIS — R531 Weakness: Secondary | ICD-10-CM | POA: Diagnosis present

## 2023-07-15 DIAGNOSIS — H532 Diplopia: Secondary | ICD-10-CM | POA: Diagnosis present

## 2023-07-15 DIAGNOSIS — I10 Essential (primary) hypertension: Secondary | ICD-10-CM | POA: Diagnosis present

## 2023-07-15 DIAGNOSIS — E782 Mixed hyperlipidemia: Secondary | ICD-10-CM | POA: Diagnosis present

## 2023-07-15 DIAGNOSIS — R42 Dizziness and giddiness: Secondary | ICD-10-CM | POA: Diagnosis present

## 2023-07-15 DIAGNOSIS — Z79899 Other long term (current) drug therapy: Secondary | ICD-10-CM | POA: Diagnosis not present

## 2023-07-15 LAB — BASIC METABOLIC PANEL
Anion gap: 10 (ref 5–15)
Anion gap: 7 (ref 5–15)
BUN: 11 mg/dL (ref 8–23)
BUN: 10 mg/dL (ref 8–23)
CO2: 24 mmol/L (ref 22–32)
CO2: 24 mmol/L (ref 22–32)
Calcium: 8.6 mg/dL — ABNORMAL LOW (ref 8.9–10.3)
Calcium: 8.8 mg/dL — ABNORMAL LOW (ref 8.9–10.3)
Chloride: 103 mmol/L (ref 98–111)
Chloride: 106 mmol/L (ref 98–111)
Creatinine, Ser: 0.65 mg/dL (ref 0.44–1.00)
Creatinine, Ser: 0.69 mg/dL (ref 0.44–1.00)
GFR, Estimated: >60 mL/min (ref >60)
GFR, Estimated: >60 mL/min (ref >60)
Glucose, Bld: 105 mg/dL — ABNORMAL HIGH (ref 70–99)
Glucose, Bld: 122 mg/dL — ABNORMAL HIGH (ref 70–99)
Potassium: 2.8 mmol/L — ABNORMAL LOW (ref 3.5–5.1)
Potassium: 3.6 mmol/L (ref 3.5–5.1)
Sodium: 137 mmol/L (ref 135–145)
Sodium: 137 mmol/L (ref 135–145)

## 2023-07-15 LAB — HIV ANTIBODY (ROUTINE TESTING W REFLEX): HIV Screen 4th Generation wRfx: NONREACTIVE

## 2023-07-15 LAB — VITAMIN D 25 HYDROXY (VIT D DEFICIENCY, FRACTURES): Vit D, 25-Hydroxy: 26.61 ng/mL — ABNORMAL LOW (ref 30–100)

## 2023-07-15 MED ORDER — POTASSIUM CHLORIDE CRYS ER 20 MEQ PO TBCR
40.0000 meq | EXTENDED_RELEASE_TABLET | ORAL | Status: AC
  Administered 2023-07-15 (×2): 40 meq via ORAL
  Filled 2023-07-15 (×2): qty 2

## 2023-07-15 MED ORDER — MELATONIN 5 MG PO TABS
5.0000 mg | ORAL_TABLET | Freq: Every evening | ORAL | Status: DC | PRN
  Administered 2023-07-15: 5 mg via ORAL
  Filled 2023-07-15: qty 1

## 2023-07-15 NOTE — Progress Notes (Signed)
Mobility Specialist - Progress Note   07/15/23 1123  Mobility  Activity Ambulated with assistance in hallway  Level of Assistance Standby assist, set-up cues, supervision of patient - no hands on  Assistive Device None  Distance Ambulated (ft) 500 ft  Range of Motion/Exercises Active  Activity Response Tolerated well  Mobility Referral Yes  $Mobility charge 1 Mobility  Mobility Specialist Start Time (ACUTE ONLY) 1113  Mobility Specialist Stop Time (ACUTE ONLY) 1122  Mobility Specialist Time Calculation (min) (ACUTE ONLY) 9 min   Pt received in bed and agreed to mobility. Had no issues throughout session, returned to bed with all needs met.  Marilynne Halsted Mobility Specialist

## 2023-07-15 NOTE — Progress Notes (Addendum)
  Progress Note   Patient: Alexis Sloan [id number]:[id number] DOB: [birth year] DOA: 07/14/2023     0 DOS: the patient was seen and examined on 07/15/2023   Brief hospital course: 67 year old woman presented with dizziness, double vision, elevated blood pressure.  Admitted for elevated blood pressure, generalized weakness and hypokalemia.  Assessment and Plan: * Hypokalemia Still low. Replete aggressively. BMP in AM Discontinue hydrochlorothiazide  Generalized weakness Likely due to hypokalemia and poor oral intake TSH and B12 WNL   Essential hypertension Discontinued hydrochlorothiazide and started lisinopril 20mg   continue CCB for now, but has had complaints of leg swelling  will need close f/u with PCP outpatient   Dizziness Hx of BPPV diagnosed 2 months ago.   CT head negative. Normal neuro exam   Mixed hyperlipidemia Continue lipitor daily   Prediabetes Hgb A1c 6.1     Subjective:  Feels a little better  Entire interview, exam and discussion with iPad interpreter assistance  Physical Exam: Vitals:   07/14/23 2152 07/15/23 0151 07/15/23 0548 07/15/23 1418  BP: 124/71 111/66 117/70 111/73  Pulse: 66 (!) 59 66 (!) 59  Resp: 14 17 17 16   Temp: 98.4 F (36.9 C) 98.2 F (36.8 C) 98.1 F (36.7 C) 98.2 F (36.8 C)  TempSrc: Oral Oral Oral Oral  SpO2: 96% 97% 97% 96%  Weight:      Height:       Physical Exam Vitals reviewed.  Constitutional:      General: She is not in acute distress.    Appearance: She is not ill-appearing or toxic-appearing.  Cardiovascular:     Rate and Rhythm: Normal rate and regular rhythm.     Heart sounds: No murmur heard. Pulmonary:     Effort: Pulmonary effort is normal. No respiratory distress.     Breath sounds: No wheezing, rhonchi or rales.  Neurological:     Mental Status: She is alert.  Psychiatric:        Mood and Affect: Mood normal.        Behavior: Behavior normal.     Data Reviewed: K+ 2.8  Family  Communication: none  Disposition: Status is: Inpatient     Time spent: 20 minutes  Author: Brendia Sacks, MD 07/15/2023 7:52 PM  For on call review [email or web address].

## 2023-07-15 NOTE — Hospital Course (Addendum)
67 year old woman presented with dizziness, double vision, elevated blood pressure.  Admitted for elevated blood pressure, generalized weakness and hypokalemia.

## 2023-07-15 NOTE — TOC Initial Note (Signed)
Transition of Care [facility]) - Initial/Assessment Note    Patient Details  Name: Alexis Sloan MRN: [id number] Date of Birth: May 27, 1956  [facility] CM/SW Contact:    Otelia Santee, LCSW Phone Number: 07/15/2023, 11:29 AM  Clinical Narrative:                 Pt has no insurance and no PCP listed in system. Met with pt who shares she has been seeing provider at [facility] however, was not happy with how provider has been treating her and requests new provider.  PCP appointment has been scheduled at the [facility] for 08/21/23 at 1:40pm. Information has been added to follow up.   Expected Discharge Plan: Home/Self Care Barriers to Discharge: No Barriers Identified   Patient Goals and CMS Choice Patient states their goals for this hospitalization and ongoing recovery are:: To return home CMS Medicare.gov Compare Post Acute Care list provided to:: Patient Choice offered to / list presented to : Patient [place] ownership interest in [facility].provided to::  (NA)    Expected Discharge Plan and Services In-house Referral: NA Discharge Planning Services: [facility] Post Acute Care Choice: NA Living arrangements for the past 2 months: Single Family Home                 DME Arranged: N/A DME Agency: NA                  Prior Living Arrangements/Services Living arrangements for the past 2 months: Single Family Home Lives with:: Adult Children Patient language and need for interpreter reviewed:: Yes (Spanish) Do you feel safe going back to the place where you live?: Yes      Need for Family Participation in Patient Care: No (Comment) Care giver support system in place?: No (comment)   Criminal Activity/Legal Involvement Pertinent to Current Situation/Hospitalization: No - Comment as needed  Activities of Daily Living Home Assistive Devices/Equipment: None ADL Screening (condition at time of  admission) Patient's cognitive ability adequate to safely complete daily activities?: Yes Is the patient deaf or have difficulty hearing?: No Does the patient have difficulty seeing, even when wearing glasses/contacts?: No Does the patient have difficulty concentrating, remembering, or making decisions?: No Patient able to express need for assistance with ADLs?: Yes Does the patient have difficulty dressing or bathing?: No Independently performs ADLs?: Yes (appropriate for developmental age) Does the patient have difficulty walking or climbing stairs?: No Weakness of Legs: None Weakness of Arms/Hands: None  Permission Sought/Granted   Permission granted to share information with : No              Emotional Assessment Appearance:: Appears stated age Attitude/Demeanor/Rapport: Engaged Affect (typically observed): Accepting, Pleasant Orientation: : Oriented to Self, Oriented to Place, Oriented to  Time, Oriented to Situation Alcohol / Substance Use: Not Applicable Psych Involvement: No (comment)  Admission diagnosis:  Hypokalemia [E87.6] Patient Active Problem List   Diagnosis Date Noted   Hypokalemia 07/14/2023   Prediabetes 07/14/2023   Essential hypertension 07/14/2023   Weakness 07/14/2023   Dizziness 07/14/2023   Bradycardia 07/26/2021   Mixed hyperlipidemia 07/26/2021   Endotracheally intubated 07/15/2021   Toxic encephalopathy 07/15/2021   Overdose, drug, undetermined intent, initial encounter 07/15/2021   Leukocytosis 07/15/2021   PCP:  Pcp, No Pharmacy:   [id number] - [place], [place] - [address] AT [facility] [address] [phone]  [address] [place] [address] Phone: [phone] Fax: [phone]     Social Determinants of Health (SDOH) Social History: SDOH Screenings   Food Insecurity: Patient Declined (07/14/2023)  Tobacco Use: Low Risk  (07/15/2023)   SDOH Interventions:     Readmission Risk  Interventions     No data to display

## 2023-07-15 NOTE — Plan of Care (Signed)

## 2023-07-16 LAB — BASIC METABOLIC PANEL
Anion gap: 6 (ref 5–15)
BUN: 12 mg/dL (ref 8–23)
CO2: 23 mmol/L (ref 22–32)
Calcium: 9.1 mg/dL (ref 8.9–10.3)
Chloride: 110 mmol/L (ref 98–111)
Creatinine, Ser: 0.75 mg/dL (ref 0.44–1.00)
GFR, Estimated: >60 mL/min (ref >60)
Glucose, Bld: 99 mg/dL (ref 70–99)
Potassium: 3.9 mmol/L (ref 3.5–5.1)
Sodium: 139 mmol/L (ref 135–145)

## 2023-07-16 MED ORDER — LISINOPRIL 20 MG PO TABS: 20.0000 mg | ORAL_TABLET | Freq: Every day | ORAL | 2 refills | Status: DC

## 2023-07-16 NOTE — Discharge Summary (Signed)
Physician Discharge Summary   Patient: Alexis Sloan MRN: [id number] DOB: 06-Apr-1956  Admit date:     07/14/2023  Discharge date: 07/16/23  Discharge Physician: Brendia Sacks   PCP: Pcp, No   Recommendations at discharge:   See below  Discharge Diagnoses: Principal Problem:   Hypokalemia Active Problems:   Weakness   Essential hypertension   Dizziness   Mixed hyperlipidemia   Prediabetes  Resolved Problems:   * No resolved hospital problems. *  Hospital Course: 67 year old woman presented with dizziness, double vision, elevated blood pressure.  Admitted for elevated blood pressure, generalized weakness and hypokalemia.  * Hypokalemia Now resolved Stop HCTZ   Generalized weakness Likely due to hypokalemia and poor oral intake TSH and B12 WNL    Essential hypertension Discontinued hydrochlorothiazide and started lisinopril 20mg   Continue CCB for now, but has had complaints of leg swelling  Will need close f/u with PCP outpatient    Dizziness Hx of BPPV diagnosed 2 months ago.   CT head negative. Normal neuro exam    Mixed hyperlipidemia Continue lipitor daily    Prediabetes Hgb A1c 6.1    Consultants:  None  Procedures performed:  None   Disposition: Home Diet recommendation:  Discharge Diet Orders (From admission, onward)     Start     Ordered   07/16/23 0000  Diet - low sodium heart healthy        07/16/23 1131           Regular diet DISCHARGE MEDICATION: Allergies as of 07/16/2023   No Known Allergies      Medication List     TAKE these medications    amLODipine 10 MG tablet Commonly known as: NORVASC Take 10 mg by mouth daily.   atorvastatin 10 MG tablet Commonly known as: LIPITOR Take 10 mg by mouth daily.   lisinopril 20 MG tablet Commonly known as: ZESTRIL Take 1 tablet (20 mg total) by mouth daily. Start taking on: July 17, 2023        Follow-up Information     [facility]  Follow up on 08/21/2023.   Specialty: Internal Medicine Why: Leroy Sea cita de atencin primaria el viernes 23/8/24 a las 13:40 horas. Llegue 15 minutos antes para completar la documentacin de paciente nuevo. (Para solicitar servicios de un intrprete, por favor llame a la oficina antes de su cita) Contact information: [address] [place] [address] [phone]               Feels better  Discharge Exam: Filed Weights   07/14/23 1120  Weight: 70 kg   Physical Exam Vitals reviewed.  Constitutional:      General: She is not in acute distress.    Appearance: She is not ill-appearing or toxic-appearing.  Cardiovascular:     Rate and Rhythm: Normal rate and regular rhythm.     Heart sounds: No murmur heard. Pulmonary:     Effort: Pulmonary effort is normal. No respiratory distress.     Breath sounds: No wheezing, rhonchi or rales.  Neurological:     Mental Status: She is alert.  Psychiatric:        Mood and Affect: Mood normal.        Behavior: Behavior normal.      Condition at discharge: good  The results of significant diagnostics from this hospitalization (including imaging, microbiology, ancillary and laboratory) are listed below for reference.   Imaging Studies: CT Head  Wo Contrast  Result Date: 07/14/2023 CLINICAL DATA:  Dizziness and blurry vision for a few months. History of hypertension. EXAM: CT HEAD WITHOUT CONTRAST TECHNIQUE: Contiguous axial images were obtained from the base of the skull through the vertex without intravenous contrast. RADIATION DOSE REDUCTION: This exam was performed according to the departmental dose-optimization program which includes automated exposure control, adjustment of the mA and/or kV according to patient size and/or use of iterative reconstruction technique. COMPARISON:  07/15/2021 FINDINGS: Brain: No evidence of acute infarction, hemorrhage, hydrocephalus, extra-axial collection or mass lesion/mass effect.  Vascular: No hyperdense vessel or unexpected calcification. Skull: Normal. Negative for fracture or focal lesion. Sinuses/Orbits: No acute finding. IMPRESSION: Normal head CT. Electronically Signed   By: Tiburcio Pea M.D.   On: 07/14/2023 13:45    Microbiology: Results for orders placed or performed during the hospital encounter of 07/15/21  SARS CORONAVIRUS 2 (TAT 6-24 HRS) Nasopharyngeal Nasopharyngeal Swab     Status: None   Collection Time: 07/15/21  5:50 AM   Specimen: Nasopharyngeal Swab  Result Value Ref Range Status   SARS Coronavirus 2 NEGATIVE NEGATIVE Final    Comment: (NOTE) SARS-CoV-2 target nucleic acids are NOT DETECTED.  The SARS-CoV-2 RNA is generally detectable in upper and lower respiratory specimens during the acute phase of infection. Negative results do not preclude SARS-CoV-2 infection, do not rule out co-infections with other pathogens, and should not be used as the sole basis for treatment or other patient management decisions. Negative results must be combined with clinical observations, patient history, and epidemiological information. The expected result is Negative.  Fact Sheet for Patients: HairSlick.no  Fact Sheet for Healthcare Providers: quierodirigir.com  This test is not yet approved or cleared by the [place] FDA and  has been authorized for detection and/or diagnosis of SARS-CoV-2 by FDA under an Emergency Use Authorization (EUA). This EUA will remain  in effect (meaning this test can be used) for the duration of the COVID-19 declaration under Se ction 564(b)(1) of the Act, 21 U.S.C. section 360bbb-3(b)(1), unless the authorization is terminated or revoked sooner.  Performed at [facility], [address], [place], [place] [address]   Blood culture (routine x 2)     Status: None   Collection Time: 07/15/21  6:10 AM   Specimen: BLOOD  Result Value Ref Range Status    Specimen Description   Final    BLOOD BLOOD LEFT FOREARM Performed at [facility], [address] [place], [place] [address]    Special Requests   Final    BOTTLES DRAWN AEROBIC AND ANAEROBIC Blood Culture adequate volume Performed at [facility], [address], [place], [place] [address]    Culture   Final    NO GROWTH 5 DAYS Performed at [facility], [address] [place], [place] [address]    Report Status 07/20/2021 FINAL  Final  Blood culture (routine x 2)     Status: None   Collection Time: 07/15/21  6:29 AM   Specimen: BLOOD  Result Value Ref Range Status   Specimen Description   Final    BLOOD BLOOD RIGHT HAND Performed at [facility], [address] [place], [place] [address]    Special Requests   Final    BOTTLES DRAWN AEROBIC ONLY Blood Culture results may not be optimal due to an excessive volume of blood received in culture bottles Performed at [facility], [address] [place],  [place] [address]    Culture   Final    NO GROWTH 5 DAYS Performed at [facility], [address], [place], [place] [address]    Report Status 07/20/2021 FINAL  Final  MRSA Next Gen by PCR, Nasal     Status: None   Collection Time: 07/15/21  7:33 AM   Specimen: Nasal Mucosa; Nasal Swab  Result Value Ref Range Status   MRSA by PCR Next Gen NOT DETECTED NOT DETECTED Final    Comment: (NOTE) The GeneXpert MRSA Assay (FDA approved for NASAL specimens only), is one component of a comprehensive MRSA colonization surveillance program. It is not intended to diagnose MRSA infection nor to guide or monitor treatment for MRSA infections. Test performance is not FDA approved in patients less than 2 years old. Performed at [facility], [address] [place], [place] [address]     Labs: CBC: Recent Labs  Lab 07/14/23 1344  WBC 9.8  NEUTROABS 6.5  HGB 14.1  HCT 41.1   MCV 87.4  PLT 269   Basic Metabolic Panel: Recent Labs  Lab 07/14/23 1344 07/14/23 1848 07/15/23 0522 07/15/23 1804 07/16/23 0609  NA 138  --  137 137 139  K 2.3*  --  2.8* 3.6 3.9  CL 100  --  103 106 110  CO2 26  --  24 24 23   GLUCOSE 105*  --  105* 122* 99  BUN 13  --  11 10 12   CREATININE 0.74  --  0.65 0.69 0.75  CALCIUM 9.6  --  8.6* 8.8* 9.1  MG  --  2.3  --   --   --    Liver Function Tests: No results for input(s): "AST", "ALT", "ALKPHOS", "BILITOT", "PROT", "ALBUMIN" in the last 168 hours. CBG: No results for input(s): "GLUCAP" in the last 168 hours.  Discharge time spent: greater than 30 minutes.  Signed: Brendia Sacks, MD [facility] 07/16/2023

## 2023-07-16 NOTE — Progress Notes (Signed)
Mobility Specialist - Progress Note  Pre-mobility: 65 bpm HR,  During mobility: 77 bpm HR, Post-mobility: 69 bpm HR,    07/16/23 0944  Mobility  Activity Ambulated independently in hallway  Level of Assistance Independent  Assistive Device None  Distance Ambulated (ft) 800 ft  Range of Motion/Exercises Active  Activity Response Tolerated well  Mobility Referral Yes  $Mobility charge 1 Mobility  Mobility Specialist Start Time (ACUTE ONLY) [id number]  Mobility Specialist Stop Time (ACUTE ONLY) 0944  Mobility Specialist Time Calculation (min) (ACUTE ONLY) 19 min   Pt was found in bed and agreeable to ambulate. Stated her eyes feeling heavy and a little dizzy when getting up from bed. During ambulation stated dizziness going away. At EOS retunred to bed with all needs met. Bed alarm on and call bell in reach.  Billey Chang Mobility Specialist

## 2023-07-16 NOTE — Progress Notes (Signed)
Patient and son provided with discharge education, patient and son verbalized understanding. IV removed.

## 2023-08-21 ENCOUNTER — Encounter: Payer: Self-pay | Admitting: Nurse Practitioner

## 2023-08-21 ENCOUNTER — Ambulatory Visit (INDEPENDENT_AMBULATORY_CARE_PROVIDER_SITE_OTHER): Payer: Self-pay | Admitting: Nurse Practitioner

## 2023-08-21 VITALS — BP 127/65 | HR 65 | Resp 16 | Ht 58.5 in | Wt 151.8 lb

## 2023-08-21 DIAGNOSIS — Z1211 Encounter for screening for malignant neoplasm of colon: Secondary | ICD-10-CM | POA: Insufficient documentation

## 2023-08-21 DIAGNOSIS — R7303 Prediabetes: Secondary | ICD-10-CM

## 2023-08-21 DIAGNOSIS — E782 Mixed hyperlipidemia: Secondary | ICD-10-CM

## 2023-08-21 DIAGNOSIS — F321 Major depressive disorder, single episode, moderate: Secondary | ICD-10-CM

## 2023-08-21 DIAGNOSIS — E876 Hypokalemia: Secondary | ICD-10-CM

## 2023-08-21 DIAGNOSIS — I10 Essential (primary) hypertension: Secondary | ICD-10-CM

## 2023-08-21 DIAGNOSIS — R42 Dizziness and giddiness: Secondary | ICD-10-CM

## 2023-08-21 DIAGNOSIS — M542 Cervicalgia: Secondary | ICD-10-CM

## 2023-08-21 DIAGNOSIS — Z1231 Encounter for screening mammogram for malignant neoplasm of breast: Secondary | ICD-10-CM

## 2023-08-21 DIAGNOSIS — R600 Localized edema: Secondary | ICD-10-CM

## 2023-08-21 MED ORDER — AMLODIPINE BESYLATE 5 MG PO TABS: 5.0000 mg | ORAL_TABLET | Freq: Every day | ORAL | 1 refills | Status: DC

## 2023-08-21 MED ORDER — LISINOPRIL 40 MG PO TABS: 40.0000 mg | ORAL_TABLET | Freq: Every day | ORAL | 1 refills | Status: DC

## 2023-08-21 MED ORDER — MECLIZINE HCL 25 MG PO TABS
25.0000 mg | ORAL_TABLET | Freq: Three times a day (TID) | ORAL | 0 refills | Status: DC | PRN
Start: 2023-08-21 — End: 2023-09-18

## 2023-08-21 NOTE — Assessment & Plan Note (Signed)
May take OTC Tylenol as needed application of heat and ice encouraged

## 2023-08-21 NOTE — Patient Instructions (Addendum)
Please get your shingles vaccine, pneumonia , TDAP at the pharmacy   Please keep your legs elevated when sitting, wear compression socks, avoid salty foods to help with leg swelling  Please come fasting for your next appointment  1. Screening for colon cancer  - Cologuard  2. Screening mammogram for breast cancer  - MM Digital Screening; Future  3. Prediabetes   4. Essential hypertension  - Basic Metabolic Panel - amLODipine (NORVASC) 5 MG tablet; Take 1 tablet (5 mg total) by mouth daily.  Dispense: 90 tablet; Refill: 1 - lisinopril (ZESTRIL) 40 MG tablet; Take 1 tablet (40 mg total) by mouth daily.  Dispense: 90 tablet; Refill: 1   Please call the offivce with blood pressure readings less than 100/60   It is important that you exercise regularly at least 30 minutes 5 times a week as tolerated  Think about what you will eat, plan ahead. Choose " clean, green, fresh or frozen" over canned, processed or packaged foods which are more sugary, salty and fatty. 70 to 75% of food eaten should be vegetables and fruit. Three meals at set times with snacks allowed between meals, but they must be fruit or vegetables. Aim to eat over a 12 hour period , example 7 am to 7 pm, and STOP after  your last meal of the day. Drink water,generally about 64 ounces per day, no other drink is as healthy. Fruit juice is best enjoyed in a healthy way, by EATING the fruit.  Thanks for choosing [facility] we consider it a privelige to serve you.      It is important that you exercise regularly at least 30 minutes 5 times a week as tolerated  Think about what you will eat, plan ahead. Choose " clean, green, fresh or frozen" over canned, processed or packaged foods which are more sugary, salty and fatty. 70 to 75% of food eaten should be vegetables and fruit. Three meals at set times with snacks allowed between meals, but they must be fruit or vegetables. Aim to eat over a 12 hour period ,  example 7 am to 7 pm, and STOP after  your last meal of the day. Drink water,generally about 64 ounces per day, no other drink is as healthy. Fruit juice is best enjoyed in a healthy way, by EATING the fruit.  Thanks for choosing [facility] we consider it a privelige to serve you.    Marland Kitchen

## 2023-08-21 NOTE — Assessment & Plan Note (Signed)
Denies SI, HI Refused referral for counseling refused medications Continue to monitor Flowsheet Row Office Visit from 08/21/2023 in [facility]  PHQ-9 Total Score 11

## 2023-08-21 NOTE — Assessment & Plan Note (Addendum)
Due to be PPV Meclizine 25 mg 3 times daily as needed refilled

## 2023-08-21 NOTE — Assessment & Plan Note (Signed)
No results found for: "CHOL", "HDL", "LDLCALC", "LDLDIRECT", "TRIG", "CHOLHDL"  On atorvastatin 10 mg daily Will obtain fasting lipid panel at next visit

## 2023-08-21 NOTE — Assessment & Plan Note (Signed)
BP Readings from Last 3 Encounters:  08/21/23 127/65  07/16/23 115/75  08/26/21 (!) 150/81  Blood pressure is well-controlled on amlodipine 10 mg daily, lisinopril 20 mg daily However the patient has pitting edema to bilateral lower extremities Will decreased amlodipine to 5 mg daily increase lisinopril to 40 mg daily Encouraged to monitor blood pressure at home and report hypotension Follow-up in the office in 4 weeks DASH diet advised encouraged to engage in regular moderate exercises at least 150 minutes weekly Checking BMP today

## 2023-08-21 NOTE — Progress Notes (Signed)
New Patient Office Visit  Subjective:  Patient ID: Alexis Sloan, female    DOB: 27-May-1956  Age: 67 y.o. MRN: [id number]  CC:  Chief Complaint  Patient presents with   [facility] admission hypokalemia. Been doing good but her ankles are swollen and a little dizzy, she gets out of breath easily. She is very tired, her neck hurts. Unsure of colonoscopy and only 1 mammogram in her life.    HPI Alexis Sloan is a 67 y.o. female  has a past medical history of BPPV (benign paroxysmal positional vertigo), Hyperlipidemia, Hypertension, and Prediabetes.BPPV. Patient presents to establish care for her chronic medical conditions.  Has no current PCP.  Patient was on admission at the hospital from 07/14/2023 to 07/16/2023 for hypokalemia.  Patient reports feeling better since she left the hospital.  Depression.  For the past 1 to 2 months.  Does not know what is is causing her depression.  She denies SI, HI.  She declined referral for counseling, declined medication.  Hypertension.  Currently on lisinopril 20 mg daily, amlodipine 10 mg daily.  Has pitting edema to bilateral lower extremities.  She is accompanied by her son whom she lives with.  Patient complains of neck pain that started since she left the hospital.  Not taking any medication currently for her pain.  Due for mammogram, mammogram scholarship form provided since the patient is uninsured.  Cologuard ordered to screen for colon cancer  Need for shingles vaccine, Tdap vaccine, pneumonia vaccines discussed patient encouraged to get the vaccines at the pharmacy.  Interpretation services provided via [place] iPad     Past Medical History:  Diagnosis Date   BPPV (benign paroxysmal positional vertigo)    Hyperlipidemia    Hypertension    Prediabetes     Past Surgical History:  Procedure Laterality Date   HYSTEROTOMY      History reviewed. No pertinent family history.  Social  History   Socioeconomic History   Marital status: Single    Spouse name: Not on file   Number of children: 2   Years of education: Not on file   Highest education level: Not on file  Occupational History   Not on file  Tobacco Use   Smoking status: Never    Passive exposure: Never   Smokeless tobacco: Never  Vaping Use   Vaping status: Never Used  Substance and Sexual Activity   Alcohol use: Never   Drug use: Never   Sexual activity: Not on file  Other Topics Concern   Not on file  Social History Narrative   Lives with her son.    Social Determinants of Health   Financial Resource Strain: Not on file  Food Insecurity: Patient Declined (07/14/2023)   Hunger Vital Sign    Worried About Running Out of Food in the Last Year: Patient declined    Ran Out of Food in the Last Year: Patient declined  Transportation Needs: Not on file  Physical Activity: Not on file  Stress: Not on file  Social Connections: Not on file  Intimate Partner Violence: Not on file    ROS Review of Systems  Constitutional:  Negative for activity change, appetite change, chills and fever.  HENT:  Negative for congestion, dental problem, ear discharge, ear pain and hearing loss.   Eyes:  Negative for pain, discharge, redness and itching.  Respiratory:  Negative for cough, chest tightness, shortness of breath and wheezing.   Cardiovascular:  Negative for chest pain, palpitations and leg swelling.  Gastrointestinal:  Negative for abdominal distention, abdominal pain, anal bleeding, blood in stool and constipation.  Genitourinary:  Negative for difficulty urinating, dysuria, flank pain, frequency, hematuria, menstrual problem, pelvic pain and vaginal bleeding.  Musculoskeletal:  Negative for arthralgias, back pain, gait problem and myalgias.       Leg edema  Skin:  Negative for color change, pallor, rash and wound.  Neurological:  Positive for dizziness. Negative for tremors, facial asymmetry, weakness  and headaches.  Hematological:  Negative for adenopathy. Does not bruise/bleed easily.  Psychiatric/Behavioral:  Negative for confusion, decreased concentration, hallucinations, self-injury and suicidal ideas.     Objective:   Today's Vitals: BP 127/65   Pulse 65   Resp 16   Ht 4' 10.5" (1.486 m)   Wt 151 lb 12.8 oz (68.9 kg)   SpO2 96%   BMI 31.19 kg/m   Physical Exam Vitals and nursing note reviewed.  Constitutional:      General: She is not in acute distress.    Appearance: Normal appearance. She is obese. She is not ill-appearing, toxic-appearing or diaphoretic.  HENT:     Mouth/Throat:     Mouth: Mucous membranes are moist.     Pharynx: Oropharynx is clear. No oropharyngeal exudate or posterior oropharyngeal erythema.  Eyes:     General: No scleral icterus.       Right eye: No discharge.        Left eye: No discharge.     Extraocular Movements: Extraocular movements intact.     Conjunctiva/sclera: Conjunctivae normal.  Cardiovascular:     Rate and Rhythm: Normal rate and regular rhythm.     Pulses: Normal pulses.     Heart sounds: Normal heart sounds. No murmur heard.    No friction rub. No gallop.  Pulmonary:     Effort: Pulmonary effort is normal. No respiratory distress.     Breath sounds: Normal breath sounds. No stridor. No wheezing, rhonchi or rales.  Chest:     Chest wall: No tenderness.  Abdominal:     General: There is no distension.     Palpations: Abdomen is soft.     Tenderness: There is no abdominal tenderness. There is no right CVA tenderness, left CVA tenderness or guarding.  Musculoskeletal:        General: No swelling, tenderness, deformity or signs of injury.     Right lower leg: Edema present.     Left lower leg: Edema present.  Skin:    General: Skin is warm and dry.     Capillary Refill: Capillary refill takes less than 2 seconds.     Coloration: Skin is not jaundiced or pale.     Findings: No bruising, erythema or lesion.  Neurological:      Mental Status: She is alert and oriented to person, place, and time.     Motor: No weakness.     Coordination: Coordination normal.     Gait: Gait normal.  Psychiatric:        Mood and Affect: Mood normal.        Behavior: Behavior normal.        Thought Content: Thought content normal.        Judgment: Judgment normal.     Assessment & Plan:   Problem List Items Addressed This Visit       Cardiovascular and Mediastinum   Essential hypertension    BP Readings from Last 3 Encounters:  08/21/23  127/65  07/16/23 115/75  08/26/21 (!) 150/81  Blood pressure is well-controlled on amlodipine 10 mg daily, lisinopril 20 mg daily However the patient has pitting edema to bilateral lower extremities Will decreased amlodipine to 5 mg daily increase lisinopril to 40 mg daily Encouraged to monitor blood pressure at home and report hypotension Follow-up in the office in 4 weeks DASH diet advised encouraged to engage in regular moderate exercises at least 150 minutes weekly Checking BMP today      Relevant Medications   amLODipine (NORVASC) 5 MG tablet   lisinopril (ZESTRIL) 40 MG tablet   Other Relevant Orders   Basic Metabolic Panel   Basic Metabolic Panel     Other   Mixed hyperlipidemia    No results found for: "CHOL", "HDL", "LDLCALC", "LDLDIRECT", "TRIG", "CHOLHDL"  On atorvastatin 10 mg daily Will obtain fasting lipid panel at next visit      Relevant Medications   amLODipine (NORVASC) 5 MG tablet   lisinopril (ZESTRIL) 40 MG tablet   Hypokalemia    Lab Results  Component Value Date   NA 139 07/16/2023   K 3.9 07/16/2023   CO2 23 07/16/2023   GLUCOSE 99 07/16/2023   BUN 12 07/16/2023   CREATININE 0.75 07/16/2023   CALCIUM 9.1 07/16/2023   GFRNONAA >60 07/16/2023  Hypokalemia resolved      Prediabetes    Lab Results  Component Value Date   HGBA1C 6.1 (H) 07/14/2023  Avoid sugar sweets soda      Dizziness    Due to be PPV Meclizine 25 mg 3 times daily  as needed refilled      Relevant Medications   meclizine (ANTIVERT) 25 MG tablet   Screening for colon cancer - Primary   Relevant Orders   Cologuard   Neck pain    May take OTC Tylenol as needed application of heat and ice encouraged      Current moderate episode of major depressive disorder (HCC)    Denies SI, HI Refused referral for counseling refused medications Continue to monitor Flowsheet Row Office Visit from 08/21/2023 in [facility]  PHQ-9 Total Score 11            Bilateral leg edema    Amlodipine decreased to 5 mg daily from 10 mg daily Encouraged to wear compression socks keep legs elevated when sitting DASH diet advised      Other Visit Diagnoses     Screening mammogram for breast cancer       Relevant Orders   MM Digital Screening       Outpatient Encounter Medications as of 08/21/2023  Medication Sig   amLODipine (NORVASC) 5 MG tablet Take 1 tablet (5 mg total) by mouth daily.   atorvastatin (LIPITOR) 10 MG tablet Take 10 mg by mouth daily.   lisinopril (ZESTRIL) 40 MG tablet Take 1 tablet (40 mg total) by mouth daily.   [DISCONTINUED] amLODipine (NORVASC) 10 MG tablet Take 10 mg by mouth daily.   [DISCONTINUED] lisinopril (ZESTRIL) 20 MG tablet Take 1 tablet (20 mg total) by mouth daily.   [DISCONTINUED] meclizine (ANTIVERT) 25 MG tablet Take 25 mg by mouth every 8 (eight) hours as needed.   meclizine (ANTIVERT) 25 MG tablet Take 1 tablet (25 mg total) by mouth every 8 (eight) hours as needed.   No facility-administered encounter medications on file as of 08/21/2023.    Follow-up: Return in about 4 weeks (around 09/18/2023) for HTN.   Donell Beers,  FNP

## 2023-08-21 NOTE — Assessment & Plan Note (Signed)
Lab Results  Component Value Date   NA 139 07/16/2023   K 3.9 07/16/2023   CO2 23 07/16/2023   GLUCOSE 99 07/16/2023   BUN 12 07/16/2023   CREATININE 0.75 07/16/2023   CALCIUM 9.1 07/16/2023   GFRNONAA >60 07/16/2023  Hypokalemia resolved

## 2023-08-21 NOTE — Assessment & Plan Note (Signed)
Amlodipine decreased to 5 mg daily from 10 mg daily Encouraged to wear compression socks keep legs elevated when sitting DASH diet advised

## 2023-08-21 NOTE — Assessment & Plan Note (Signed)
Lab Results  Component Value Date   HGBA1C 6.1 (H) 07/14/2023  Avoid sugar sweets soda

## 2023-08-22 LAB — BASIC METABOLIC PANEL
BUN/Creatinine Ratio: 17 (ref 12–28)
BUN: 15 mg/dL (ref 8–27)
CO2: 23 mmol/L (ref 20–29)
Calcium: 9.7 mg/dL (ref 8.7–10.3)
Chloride: 104 mmol/L (ref 96–106)
Creatinine, Ser: 0.88 mg/dL (ref 0.57–1.00)
Glucose: 111 mg/dL — ABNORMAL HIGH (ref 70–99)
Potassium: 3.9 mmol/L (ref 3.5–5.2)
Sodium: 141 mmol/L (ref 134–144)
eGFR: 72 mL/min/{1.73_m2} (ref >59)

## 2023-09-02 ENCOUNTER — Telehealth: Payer: Self-pay

## 2023-09-02 NOTE — Telephone Encounter (Signed)
Telephoned patient using interpreter#[id number]. Telephone answered, interpreter asked for patient and the call was disconnected. [facility]

## 2023-09-07 ENCOUNTER — Encounter: Payer: Self-pay | Admitting: *Deleted

## 2023-09-18 ENCOUNTER — Encounter: Payer: Self-pay | Admitting: Nurse Practitioner

## 2023-09-18 ENCOUNTER — Ambulatory Visit (INDEPENDENT_AMBULATORY_CARE_PROVIDER_SITE_OTHER): Payer: Self-pay | Admitting: Nurse Practitioner

## 2023-09-18 VITALS — BP 121/70 | HR 68 | Temp 98.4°F | Ht 58.5 in | Wt 149.6 lb

## 2023-09-18 DIAGNOSIS — R600 Localized edema: Secondary | ICD-10-CM

## 2023-09-18 DIAGNOSIS — Z23 Encounter for immunization: Secondary | ICD-10-CM

## 2023-09-18 DIAGNOSIS — E782 Mixed hyperlipidemia: Secondary | ICD-10-CM

## 2023-09-18 DIAGNOSIS — I10 Essential (primary) hypertension: Secondary | ICD-10-CM

## 2023-09-18 DIAGNOSIS — M25552 Pain in left hip: Secondary | ICD-10-CM

## 2023-09-18 DIAGNOSIS — H8110 Benign paroxysmal vertigo, unspecified ear: Secondary | ICD-10-CM

## 2023-09-18 DIAGNOSIS — Z1159 Encounter for screening for other viral diseases: Secondary | ICD-10-CM

## 2023-09-18 MED ORDER — DICLOFENAC SODIUM 1 % EX GEL
4.0000 g | Freq: Four times a day (QID) | CUTANEOUS | 1 refills | Status: DC
Start: 2023-09-18 — End: 2024-01-18

## 2023-09-18 MED ORDER — MECLIZINE HCL 25 MG PO TABS
25.0000 mg | ORAL_TABLET | Freq: Three times a day (TID) | ORAL | 0 refills | Status: DC | PRN
Start: 2023-09-18 — End: 2024-04-18

## 2023-09-18 NOTE — Assessment & Plan Note (Signed)
Application of heat or ice, engaging in regular moderate daily exercises encouraged Voltaren gel 4 g 4 times daily ordered

## 2023-09-18 NOTE — Assessment & Plan Note (Signed)
Meclizine 25 mg 3 times daily as needed refilled Patient encouraged to avoid changing position abruptly to minimize dizziness

## 2023-09-18 NOTE — Patient Instructions (Signed)
Please schedule Tdap vaccine, shingles vaccine at the pharmacy  . Dizziness  - meclizine (ANTIVERT) 25 MG tablet; Take 1 tablet (25 mg total) by mouth every 8 (eight) hours as needed.  Dispense: 30 tablet; Refill: 0  . Left hip pain  - diclofenac Sodium (VOLTAREN ARTHRITIS PAIN) 1 % GEL; Apply 4 g topically 4 (four) times daily.  Dispense: 100 g; Refill: 1    For leg swelling please wear compression socks as needed, keep your legs elevated when sitting, avoid salty foods  It is important that you exercise regularly at least 30 minutes 5 times a week as tolerated  Think about what you will eat, plan ahead. Choose " clean, green, fresh or frozen" over canned, processed or packaged foods which are more sugary, salty and fatty. 70 to 75% of food eaten should be vegetables and fruit. Three meals at set times with snacks allowed between meals, but they must be fruit or vegetables. Aim to eat over a 12 hour period , example 7 am to 7 pm, and STOP after  your last meal of the day. Drink water,generally about 64 ounces per day, no other drink is as healthy. Fruit juice is best enjoyed in a healthy way, by EATING the fruit.  Thanks for choosing [facility] we consider it a privelige to serve you.

## 2023-09-18 NOTE — Progress Notes (Signed)
Patient states legs hurt a lot, States when she reaches down and gets up she get dizzy.   Patient wants to get the flu vaccine.

## 2023-09-18 NOTE — Assessment & Plan Note (Signed)
BP Readings from Last 3 Encounters:  09/18/23 121/70  08/21/23 127/65  07/16/23 115/75   HTN Controlled .  On amlodipine 5 mg daily, lisinopril 40 mg daily Blood pressure log from home reviewed and blood pressure has been very well-controlled Continue current medications. No changes in management. Discussed DASH diet and dietary sodium restrictions Follow-up in 4 months Continue to increase dietary efforts and exercise.

## 2023-09-18 NOTE — Progress Notes (Signed)
Established Patient Office Visit  Subjective:  Patient ID: Alexis Sloan, female    DOB: October 02, 1956  Age: 67 y.o. MRN: [id number]  CC: No chief complaint on file.   HPI Alexis Sloan is a 67 y.o. female  has a past medical history of BPPV (benign paroxysmal positional vertigo), Hyperlipidemia, Hypertension, and Prediabetes.   Patient presents for follow-up for hypertension.  She is accompanied by her son who assisted with interpretation.  They declined the use of a medical interpreter today  Hypertension.  Currently on amlodipine 5 mg daily, lisinopril 40 mg daily.  Daughter stated that her leg edema has greatly improved on amlodipine 5 mg daily.  No complaints of shortness of breath, chest pain.   Left hip pain patient complains of chronic right hip pain, she reports some stiffness after prolonged sitting, pain and stiffness eases off some  with exercises.  She takes Tylenol as needed  They deny adverse reactions to current medications  Past Medical History:  Diagnosis Date   BPPV (benign paroxysmal positional vertigo)    Hyperlipidemia    Hypertension    Prediabetes     Past Surgical History:  Procedure Laterality Date   HYSTEROTOMY      History reviewed. No pertinent family history.  Social History   Socioeconomic History   Marital status: Single    Spouse name: Not on file   Number of children: 2   Years of education: Not on file   Highest education level: Not on file  Occupational History   Not on file  Tobacco Use   Smoking status: Never    Passive exposure: Never   Smokeless tobacco: Never  Vaping Use   Vaping status: Never Used  Substance and Sexual Activity   Alcohol use: Never   Drug use: Never   Sexual activity: Not on file  Other Topics Concern   Not on file  Social History Narrative   Lives with her son.    Social Determinants of Health   Financial Resource Strain: Not on file  Food Insecurity: Patient Declined  (07/14/2023)   Hunger Vital Sign    Worried About Running Out of Food in the Last Year: Patient declined    Ran Out of Food in the Last Year: Patient declined  Transportation Needs: Not on file  Physical Activity: Not on file  Stress: Not on file  Social Connections: Not on file  Intimate Partner Violence: Not on file    Outpatient Medications Prior to Visit  Medication Sig Dispense Refill   amLODipine (NORVASC) 5 MG tablet Take 1 tablet (5 mg total) by mouth daily. 90 tablet 1   atorvastatin (LIPITOR) 10 MG tablet Take 10 mg by mouth daily.     lisinopril (ZESTRIL) 40 MG tablet Take 1 tablet (40 mg total) by mouth daily. 90 tablet 1   meclizine (ANTIVERT) 25 MG tablet Take 1 tablet (25 mg total) by mouth every 8 (eight) hours as needed. 30 tablet 0   No facility-administered medications prior to visit.    No Known Allergies  ROS Review of Systems  Constitutional:  Negative for activity change, appetite change, chills, fatigue and fever.  HENT:  Negative for congestion, dental problem, ear discharge, ear pain and hearing loss.   Eyes:  Negative for pain, discharge, redness and itching.  Respiratory:  Negative for cough, chest tightness, shortness of breath and wheezing.   Cardiovascular:  Negative for chest pain, palpitations and leg swelling.  Gastrointestinal:  Negative for  abdominal distention, abdominal pain, anal bleeding, blood in stool and constipation.  Genitourinary:  Negative for difficulty urinating, dysuria, flank pain, frequency, hematuria, menstrual problem, pelvic pain and vaginal bleeding.  Musculoskeletal:  Positive for arthralgias. Negative for back pain, gait problem, joint swelling and myalgias.  Skin:  Negative for color change, pallor, rash and wound.  Neurological:  Positive for dizziness. Negative for tremors, facial asymmetry, weakness and headaches.  Hematological:  Negative for adenopathy. Does not bruise/bleed easily.  Psychiatric/Behavioral:  Negative  for agitation, behavioral problems, confusion, decreased concentration, hallucinations, self-injury and suicidal ideas.       Objective:    Physical Exam Vitals and nursing note reviewed.  Constitutional:      General: She is not in acute distress.    Appearance: Normal appearance. She is obese. She is not ill-appearing, toxic-appearing or diaphoretic.  HENT:     Mouth/Throat:     Mouth: Mucous membranes are moist.     Pharynx: Oropharynx is clear. No oropharyngeal exudate or posterior oropharyngeal erythema.  Eyes:     General: No scleral icterus.       Right eye: No discharge.        Left eye: No discharge.     Extraocular Movements: Extraocular movements intact.     Conjunctiva/sclera: Conjunctivae normal.  Cardiovascular:     Rate and Rhythm: Normal rate and regular rhythm.     Pulses: Normal pulses.     Heart sounds: Normal heart sounds. No murmur heard.    No friction rub. No gallop.  Pulmonary:     Effort: Pulmonary effort is normal. No respiratory distress.     Breath sounds: Normal breath sounds. No stridor. No wheezing, rhonchi or rales.  Chest:     Chest wall: No tenderness.  Abdominal:     General: There is no distension.     Palpations: Abdomen is soft.     Tenderness: There is no abdominal tenderness. There is no right CVA tenderness, left CVA tenderness or guarding.  Musculoskeletal:        General: No swelling, tenderness, deformity or signs of injury.     Right lower leg: No edema.     Left lower leg: No edema.  Skin:    General: Skin is warm and dry.     Capillary Refill: Capillary refill takes less than 2 seconds.     Coloration: Skin is not jaundiced or pale.     Findings: No bruising, erythema or lesion.  Neurological:     Mental Status: She is alert and oriented to person, place, and time.     Motor: No weakness.     Coordination: Coordination normal.     Gait: Gait normal.  Psychiatric:        Mood and Affect: Mood normal.        Behavior:  Behavior normal.        Thought Content: Thought content normal.        Judgment: Judgment normal.     BP 121/70   Pulse 68   Temp 98.4 F (36.9 C) (Oral)   Ht 4' 10.5" (1.486 m)   Wt 149 lb 9.6 oz (67.9 kg)   SpO2 97%   BMI 30.73 kg/m  Wt Readings from Last 3 Encounters:  09/18/23 149 lb 9.6 oz (67.9 kg)  08/21/23 151 lb 12.8 oz (68.9 kg)  07/14/23 154 lb 5.2 oz (70 kg)    Lab Results  Component Value Date   TSH 2.050 07/14/2023  Lab Results  Component Value Date   WBC 9.8 07/14/2023   HGB 14.1 07/14/2023   HCT 41.1 07/14/2023   MCV 87.4 07/14/2023   PLT 269 07/14/2023   Lab Results  Component Value Date   NA 141 08/21/2023   K 3.9 08/21/2023   CO2 23 08/21/2023   GLUCOSE 111 (H) 08/21/2023   BUN 15 08/21/2023   CREATININE 0.88 08/21/2023   BILITOT 0.5 07/15/2021   ALKPHOS 54 07/15/2021   AST 24 07/15/2021   ALT 29 07/15/2021   PROT 7.7 07/15/2021   ALBUMIN 4.2 07/15/2021   CALCIUM 9.7 08/21/2023   ANIONGAP 6 07/16/2023   EGFR 72 08/21/2023   No results found for: "CHOL" No results found for: "HDL" No results found for: "LDLCALC" No results found for: "TRIG" No results found for: "CHOLHDL" Lab Results  Component Value Date   HGBA1C 6.1 (H) 07/14/2023      Assessment & Plan:   Problem List Items Addressed This Visit       Cardiovascular and Mediastinum   Essential hypertension - Primary    BP Readings from Last 3 Encounters:  09/18/23 121/70  08/21/23 127/65  07/16/23 115/75   HTN Controlled .  On amlodipine 5 mg daily, lisinopril 40 mg daily Blood pressure log from home reviewed and blood pressure has been very well-controlled Continue current medications. No changes in management. Discussed DASH diet and dietary sodium restrictions Follow-up in 4 months Continue to increase dietary efforts and exercise.           Nervous and Auditory   BPPV (benign paroxysmal positional vertigo)    Meclizine 25 mg 3 times daily as needed  refilled Patient encouraged to avoid changing position abruptly to minimize dizziness      Relevant Medications   meclizine (ANTIVERT) 25 MG tablet     Other   Hyperlipidemia    On atorvastatin 10 mg daily Not fasting today We will check lipid panel and direct LDL      Relevant Orders   Lipid panel   LDL Cholesterol, Direct   Bilateral leg edema    Now resolved with reducing dosage of amlodipine to 5 mg daily Encouraged to keep legs elevated when sitting,, wear compression socks as needed      Left hip pain    Application of heat or ice, engaging in regular moderate daily exercises encouraged Voltaren gel 4 g 4 times daily ordered      Relevant Medications   diclofenac Sodium (VOLTAREN ARTHRITIS PAIN) 1 % GEL   Need for influenza vaccination    Patient educated on CDC recommendation for the vaccine. Verbal consent was obtained from the patient, vaccine administered by nurse, no sign of adverse reactions noted at this time. Patient education on arm soreness and use of tylenol for this patient  was discussed. Patient educated on the signs and symptoms of adverse effect and advise to contact the office if they occur.       Relevant Orders   Flu Vaccine Trivalent High Dose (Fluad) (Completed)   Other Visit Diagnoses     Need for hepatitis C screening test       Relevant Orders   Hepatitis C antibody       Meds ordered this encounter  Medications   meclizine (ANTIVERT) 25 MG tablet    Sig: Take 1 tablet (25 mg total) by mouth every 8 (eight) hours as needed.    Dispense:  30 tablet    Refill:  0   diclofenac Sodium (VOLTAREN ARTHRITIS PAIN) 1 % GEL    Sig: Apply 4 g topically 4 (four) times daily.    Dispense:  100 g    Refill:  1    Follow-up: Return in about 4 months (around 01/18/2024).    Donell Beers, FNP

## 2023-09-18 NOTE — Assessment & Plan Note (Signed)
Now resolved with reducing dosage of amlodipine to 5 mg daily Encouraged to keep legs elevated when sitting,, wear compression socks as needed

## 2023-09-18 NOTE — Assessment & Plan Note (Signed)
Patient educated on CDC recommendation for the vaccine. Verbal consent was obtained from the patient, vaccine administered by nurse, no sign of adverse reactions noted at this time. Patient education on arm soreness and use of tylenol  for this patient  was discussed. Patient educated on the signs and symptoms of adverse effect and advise to contact the office if they occur.  ?

## 2023-09-18 NOTE — Assessment & Plan Note (Signed)
On atorvastatin 10 mg daily Not fasting today We will check lipid panel and direct LDL

## 2023-09-19 LAB — LIPID PANEL
Chol/HDL Ratio: 3.7 ratio (ref 0.0–4.4)
Cholesterol, Total: 207 mg/dL — ABNORMAL HIGH (ref 100–199)
HDL: 56 mg/dL (ref >39)
LDL Chol Calc (NIH): 123 mg/dL — ABNORMAL HIGH (ref 0–99)
Triglycerides: 157 mg/dL — ABNORMAL HIGH (ref 0–149)
VLDL Cholesterol Cal: 28 mg/dL (ref 5–40)

## 2023-09-19 LAB — HEPATITIS C ANTIBODY: Hep C Virus Ab: NONREACTIVE

## 2023-09-19 LAB — LDL CHOLESTEROL, DIRECT: LDL Direct: 118 mg/dL — ABNORMAL HIGH (ref 0–99)

## 2023-09-21 ENCOUNTER — Other Ambulatory Visit: Payer: Self-pay | Admitting: Nurse Practitioner

## 2023-09-21 DIAGNOSIS — E785 Hyperlipidemia, unspecified: Secondary | ICD-10-CM

## 2023-09-21 MED ORDER — ATORVASTATIN CALCIUM 20 MG PO TABS: 20.0000 mg | ORAL_TABLET | Freq: Every day | ORAL | 1 refills | Status: DC

## 2023-10-08 LAB — COLOGUARD: COLOGUARD: NEGATIVE

## 2024-01-07 ENCOUNTER — Telehealth: Payer: Self-pay

## 2024-01-07 NOTE — Telephone Encounter (Signed)
 Called pt to find out if she wanted to have her mammo done 02/03/24. No answer lvm. KH

## 2024-01-18 ENCOUNTER — Encounter: Payer: Self-pay | Admitting: Nurse Practitioner

## 2024-01-18 ENCOUNTER — Ambulatory Visit (INDEPENDENT_AMBULATORY_CARE_PROVIDER_SITE_OTHER): Payer: Self-pay | Admitting: Nurse Practitioner

## 2024-01-18 VITALS — BP 115/65 | HR 66 | Temp 97.2°F | Wt 151.0 lb

## 2024-01-18 DIAGNOSIS — M25552 Pain in left hip: Secondary | ICD-10-CM

## 2024-01-18 DIAGNOSIS — E785 Hyperlipidemia, unspecified: Secondary | ICD-10-CM

## 2024-01-18 DIAGNOSIS — I10 Essential (primary) hypertension: Secondary | ICD-10-CM

## 2024-01-18 MED ORDER — ATORVASTATIN CALCIUM 20 MG PO TABS
20.0000 mg | ORAL_TABLET | Freq: Every day | ORAL | 1 refills | Status: DC
Start: 2024-01-18 — End: 2024-04-18

## 2024-01-18 MED ORDER — LISINOPRIL 20 MG PO TABS: 20.0000 mg | ORAL_TABLET | Freq: Every day | ORAL | 3 refills | Status: DC

## 2024-01-18 MED ORDER — AMLODIPINE BESYLATE 5 MG PO TABS
5.0000 mg | ORAL_TABLET | Freq: Every day | ORAL | 1 refills | Status: DC
Start: 2024-01-18 — End: 2024-06-24

## 2024-01-18 MED ORDER — IBUPROFEN 600 MG PO TABS
600.0000 mg | ORAL_TABLET | Freq: Three times a day (TID) | ORAL | 0 refills | Status: AC | PRN
Start: 2024-01-18 — End: ?

## 2024-01-18 NOTE — Assessment & Plan Note (Addendum)
Lab Results  Component Value Date   CHOL 207 (H) 09/18/2023   HDL 56 09/18/2023   LDLCALC 123 (H) 09/18/2023   LDLDIRECT 118 (H) 09/18/2023   TRIG 157 (H) 09/18/2023   CHOLHDL 3.7 09/18/2023  Currently on atorvastatin 20 mg daily Continue current medication Avoid fatty fried foods Checking lipid panel

## 2024-01-18 NOTE — Assessment & Plan Note (Signed)
Due to complaints of hypotension will decrease lisinopril to 20 mg daily, continue amlodipine 5 mg daily Encouraged to monitor blood pressure at home keep a log and bring to next visit in 4 weeks DASH diet and commitment to daily physical activity for a minimum of 30 minutes discussed and encouraged, as a part of hypertension management. The importance of attaining a healthy weight is also discussed.      01/18/2024    3:17 PM 09/18/2023    3:27 PM 08/21/2023    2:04 PM 07/16/2023   12:31 PM 07/16/2023    5:22 AM 07/15/2023    9:29 PM 07/15/2023    2:18 PM  BP/Weight  Systolic BP 115 121 127 115 107 124 111  Diastolic BP 65 70 65 75 68 72 73  Wt. (Lbs) 151 149.6 151.8      BMI 31.02 kg/m2 30.73 kg/m2 31.19 kg/m2

## 2024-01-18 NOTE — Assessment & Plan Note (Signed)
Encouraged to alternate ibuprofen with Tylenol 650 mg every 6 hours as needed.  She was take ibuprofen with food. - ibuprofen (ADVIL) 600 MG tablet; Take 1 tablet (600 mg total) by mouth every 8 (eight) hours as needed.  Dispense: 30 tablet; Refill: 0 - DG Hip Unilat W OR W/O Pelvis 2-3 Views Left; Future

## 2024-01-18 NOTE — Patient Instructions (Addendum)
1. Dyslipidemia, goal LDL below 100 (Primary)  - Lipid panel - atorvastatin (LIPITOR) 20 MG tablet; Take 1 tablet (20 mg total) by mouth daily.  Dispense: 90 tablet; Refill: 1  2. Essential hypertension  - CMP14+EGFR - amLODipine (NORVASC) 5 MG tablet; Take 1 tablet (5 mg total) by mouth daily.  Dispense: 90 tablet; Refill: 1 - lisinopril (ZESTRIL) 20 MG tablet; Take 1 tablet (20 mg total) by mouth daily.  Dispense: 90 tablet; Refill: 3   For your hip pain Please take ibuprofen 600mg  every 8 hours as needed for pain alternate with tylenol 650mg  every 6 hours as needed     It is important that you exercise regularly at least 30 minutes 5 times a week as tolerated  Think about what you will eat, plan ahead. Choose " clean, green, fresh or frozen" over canned, processed or packaged foods which are more sugary, salty and fatty. 70 to 75% of food eaten should be vegetables and fruit. Three meals at set times with snacks allowed between meals, but they must be fruit or vegetables. Aim to eat over a 12 hour period , example 7 am to 7 pm, and STOP after  your last meal of the day. Drink water,generally about 64 ounces per day, no other drink is as healthy. Fruit juice is best enjoyed in a healthy way, by EATING the fruit.  Thanks for choosing [facility] we consider it a privelige to serve you.

## 2024-01-18 NOTE — Progress Notes (Signed)
Established Patient Office Visit  Subjective:  Patient ID: Alexis Sloan, female    DOB: 29-Mar-1956  Age: 68 y.o. MRN: [id number]  CC:  Chief Complaint  Patient presents with   Hypertension    HPI Alexis Sloan is a 68 y.o. female  has a past medical history of BPPV (benign paroxysmal positional vertigo), Hyperlipidemia, Hypertension, and Prediabetes. Patient presents for follow up for her chronic medical conditions.    Hypertension. Currently on amlodipine 5 mg daily , lisinopril 40mg  daily.  Stated that she has recently had two episodes of hypotension  with systolic BP in the low 90's . No CP, SOB , Edema    Left hip pain she continues to have pain in the left hip worse with exercises, she's not taking any medication for her pain. Currently has aching pain rated 8/10.    Interpretation services provided via [facility] [employer]   Past Medical History:  Diagnosis Date   BPPV (benign paroxysmal positional vertigo)    Hyperlipidemia    Hypertension    Prediabetes     Past Surgical History:  Procedure Laterality Date   HYSTEROTOMY      History reviewed. No pertinent family history.  Social History   Socioeconomic History   Marital status: Single    Spouse name: Not on file   Number of children: 2   Years of education: Not on file   Highest education level: Not on file  Occupational History   Not on file  Tobacco Use   Smoking status: Never    Passive exposure: Never   Smokeless tobacco: Never  Vaping Use   Vaping status: Never Used  Substance and Sexual Activity   Alcohol use: Never   Drug use: Never   Sexual activity: Not on file  Other Topics Concern   Not on file  Social History Narrative   Lives with her son.    Social Drivers of Corporate investment banker Strain: Not on file  Food Insecurity: Patient Declined (07/14/2023)   Hunger Vital Sign    Worried About Running Out of Food in the Last Year: Patient declined    Ran  Out of Food in the Last Year: Patient declined  Transportation Needs: Not on file  Physical Activity: Not on file  Stress: Not on file  Social Connections: Not on file  Intimate Partner Violence: Not on file    Outpatient Medications Prior to Visit  Medication Sig Dispense Refill   meclizine (ANTIVERT) 25 MG tablet Take 1 tablet (25 mg total) by mouth every 8 (eight) hours as needed. 30 tablet 0   amLODipine (NORVASC) 5 MG tablet Take 1 tablet (5 mg total) by mouth daily. 90 tablet 1   atorvastatin (LIPITOR) 20 MG tablet Take 1 tablet (20 mg total) by mouth daily. 90 tablet 1   lisinopril (ZESTRIL) 40 MG tablet Take 1 tablet (40 mg total) by mouth daily. 90 tablet 1   diclofenac Sodium (VOLTAREN ARTHRITIS PAIN) 1 % GEL Apply 4 g topically 4 (four) times daily. (Patient not taking: Reported on 01/18/2024) 100 g 1   No facility-administered medications prior to visit.    No Known Allergies  ROS Review of Systems  Constitutional:  Negative for appetite change, chills, fatigue and fever.  HENT:  Negative for congestion, postnasal drip, rhinorrhea and sneezing.   Respiratory:  Negative for cough, shortness of breath and wheezing.   Cardiovascular:  Negative for chest pain, palpitations and leg swelling.  Gastrointestinal:  Negative for abdominal pain, constipation, nausea and vomiting.  Genitourinary:  Negative for difficulty urinating, dysuria, flank pain and frequency.  Musculoskeletal:  Positive for arthralgias. Negative for back pain, joint swelling and myalgias.  Skin:  Negative for color change, pallor, rash and wound.  Neurological:  Negative for dizziness, facial asymmetry, weakness, numbness and headaches.  Psychiatric/Behavioral:  Negative for behavioral problems, confusion, self-injury and suicidal ideas.       Objective:    Physical Exam Vitals and nursing note reviewed.  Constitutional:      General: She is not in acute distress.    Appearance: Normal appearance. She  is obese. She is not ill-appearing, toxic-appearing or diaphoretic.  HENT:     Mouth/Throat:     Mouth: Mucous membranes are moist.     Pharynx: Oropharynx is clear. No oropharyngeal exudate or posterior oropharyngeal erythema.  Eyes:     General: No scleral icterus.       Right eye: No discharge.        Left eye: No discharge.     Extraocular Movements: Extraocular movements intact.     Conjunctiva/sclera: Conjunctivae normal.  Cardiovascular:     Rate and Rhythm: Normal rate and regular rhythm.     Pulses: Normal pulses.     Heart sounds: Normal heart sounds. No murmur heard.    No friction rub. No gallop.  Pulmonary:     Effort: Pulmonary effort is normal. No respiratory distress.     Breath sounds: Normal breath sounds. No stridor. No wheezing, rhonchi or rales.  Chest:     Chest wall: No tenderness.  Abdominal:     General: There is no distension.     Palpations: Abdomen is soft.     Tenderness: There is no abdominal tenderness. There is no right CVA tenderness, left CVA tenderness or guarding.  Musculoskeletal:        General: No swelling, tenderness, deformity or signs of injury.     Right lower leg: No edema.     Left lower leg: No edema.     Comments: Patient able to ambulate independently with steady gait.  Has palpable pedal pulses.  Has No tenderness on palpation of the hip area.  Skin:    General: Skin is warm and dry.     Capillary Refill: Capillary refill takes less than 2 seconds.     Coloration: Skin is not jaundiced or pale.     Findings: No bruising, erythema or lesion.  Neurological:     Mental Status: She is alert and oriented to person, place, and time.     Motor: No weakness.     Coordination: Coordination normal.     Gait: Gait normal.  Psychiatric:        Mood and Affect: Mood normal.        Behavior: Behavior normal.        Thought Content: Thought content normal.        Judgment: Judgment normal.     BP 115/65   Pulse 66   Temp (!) 97.2 F  (36.2 C)   Wt 151 lb (68.5 kg)   SpO2 99%   BMI 31.02 kg/m  Wt Readings from Last 3 Encounters:  01/18/24 151 lb (68.5 kg)  09/18/23 149 lb 9.6 oz (67.9 kg)  08/21/23 151 lb 12.8 oz (68.9 kg)    Lab Results  Component Value Date   TSH 2.050 07/14/2023   Lab Results  Component Value Date   WBC 9.8  07/14/2023   HGB 14.1 07/14/2023   HCT 41.1 07/14/2023   MCV 87.4 07/14/2023   PLT 269 07/14/2023   Lab Results  Component Value Date   NA 141 08/21/2023   K 3.9 08/21/2023   CO2 23 08/21/2023   GLUCOSE 111 (H) 08/21/2023   BUN 15 08/21/2023   CREATININE 0.88 08/21/2023   BILITOT 0.5 07/15/2021   ALKPHOS 54 07/15/2021   AST 24 07/15/2021   ALT 29 07/15/2021   PROT 7.7 07/15/2021   ALBUMIN 4.2 07/15/2021   CALCIUM 9.7 08/21/2023   ANIONGAP 6 07/16/2023   EGFR 72 08/21/2023   Lab Results  Component Value Date   CHOL 207 (H) 09/18/2023   Lab Results  Component Value Date   HDL 56 09/18/2023   Lab Results  Component Value Date   LDLCALC 123 (H) 09/18/2023   Lab Results  Component Value Date   TRIG 157 (H) 09/18/2023   Lab Results  Component Value Date   CHOLHDL 3.7 09/18/2023   Lab Results  Component Value Date   HGBA1C 6.1 (H) 07/14/2023      Assessment & Plan:   Problem List Items Addressed This Visit       Cardiovascular and Mediastinum   Essential hypertension   Due to complaints of hypotension will decrease lisinopril to 20 mg daily, continue amlodipine 5 mg daily Encouraged to monitor blood pressure at home keep a log and bring to next visit in 4 weeks DASH diet and commitment to daily physical activity for a minimum of 30 minutes discussed and encouraged, as a part of hypertension management. The importance of attaining a healthy weight is also discussed.      01/18/2024    3:17 PM 09/18/2023    3:27 PM 08/21/2023    2:04 PM 07/16/2023   12:31 PM 07/16/2023    5:22 AM 07/15/2023    9:29 PM 07/15/2023    2:18 PM  BP/Weight  Systolic BP 115  161 127 115 107 124 111  Diastolic BP 65 70 65 75 68 72 73  Wt. (Lbs) 151 149.6 151.8      BMI 31.02 kg/m2 30.73 kg/m2 31.19 kg/m2               Relevant Medications   amLODipine (NORVASC) 5 MG tablet   atorvastatin (LIPITOR) 20 MG tablet   lisinopril (ZESTRIL) 20 MG tablet   Other Relevant Orders   CMP14+EGFR     Other   Hyperlipidemia - Primary   Lab Results  Component Value Date   CHOL 207 (H) 09/18/2023   HDL 56 09/18/2023   LDLCALC 123 (H) 09/18/2023   LDLDIRECT 118 (H) 09/18/2023   TRIG 157 (H) 09/18/2023   CHOLHDL 3.7 09/18/2023  Currently on atorvastatin 20 mg daily Continue current medication Avoid fatty fried foods Checking lipid panel      Relevant Medications   amLODipine (NORVASC) 5 MG tablet   atorvastatin (LIPITOR) 20 MG tablet   lisinopril (ZESTRIL) 20 MG tablet   Left hip pain   Encouraged to alternate ibuprofen with Tylenol 650 mg every 6 hours as needed.  She was take ibuprofen with food. - ibuprofen (ADVIL) 600 MG tablet; Take 1 tablet (600 mg total) by mouth every 8 (eight) hours as needed.  Dispense: 30 tablet; Refill: 0 - DG Hip Unilat W OR W/O Pelvis 2-3 Views Left; Future       Relevant Medications   ibuprofen (ADVIL) 600 MG tablet   Other Relevant Orders  DG Hip Unilat W OR W/O Pelvis 2-3 Views Left    Meds ordered this encounter  Medications   amLODipine (NORVASC) 5 MG tablet    Sig: Take 1 tablet (5 mg total) by mouth daily.    Dispense:  90 tablet    Refill:  1   atorvastatin (LIPITOR) 20 MG tablet    Sig: Take 1 tablet (20 mg total) by mouth daily.    Dispense:  90 tablet    Refill:  1   lisinopril (ZESTRIL) 20 MG tablet    Sig: Take 1 tablet (20 mg total) by mouth daily.    Dispense:  90 tablet    Refill:  3   ibuprofen (ADVIL) 600 MG tablet    Sig: Take 1 tablet (600 mg total) by mouth every 8 (eight) hours as needed.    Dispense:  30 tablet    Refill:  0    Follow-up: Return in about 4 weeks (around 02/15/2024) for  HTN.    Donell Beers, FNP

## 2024-01-20 LAB — CMP14+EGFR
ALT: 16 [IU]/L (ref 0–32)
AST: 17 [IU]/L (ref 0–40)
Albumin: 3.7 g/dL — ABNORMAL LOW (ref 3.9–4.9)
Alkaline Phosphatase: 126 [IU]/L — ABNORMAL HIGH (ref 44–121)
BUN/Creatinine Ratio: 11 — ABNORMAL LOW (ref 12–28)
BUN: 8 mg/dL (ref 8–27)
Bilirubin Total: 0.3 mg/dL (ref 0.0–1.2)
CO2: 25 mmol/L (ref 20–29)
Calcium: 8.9 mg/dL (ref 8.7–10.3)
Chloride: 104 mmol/L (ref 96–106)
Creatinine, Ser: 0.75 mg/dL (ref 0.57–1.00)
Globulin, Total: 3.4 g/dL (ref 1.5–4.5)
Glucose: 97 mg/dL (ref 70–99)
Potassium: 4.6 mmol/L (ref 3.5–5.2)
Sodium: 142 mmol/L (ref 134–144)
Total Protein: 7.1 g/dL (ref 6.0–8.5)
eGFR: 87 mL/min/{1.73_m2} (ref >59)

## 2024-01-20 LAB — LIPID PANEL
Chol/HDL Ratio: 3.4 {ratio} (ref 0.0–4.4)
Cholesterol, Total: 156 mg/dL (ref 100–199)
HDL: 46 mg/dL (ref >39)
LDL Chol Calc (NIH): 87 mg/dL (ref 0–99)
Triglycerides: 129 mg/dL (ref 0–149)
VLDL Cholesterol Cal: 23 mg/dL (ref 5–40)

## 2024-02-16 ENCOUNTER — Ambulatory Visit: Payer: Self-pay | Admitting: Nurse Practitioner

## 2024-04-18 ENCOUNTER — Ambulatory Visit (INDEPENDENT_AMBULATORY_CARE_PROVIDER_SITE_OTHER): Payer: Self-pay | Admitting: Nurse Practitioner

## 2024-04-18 ENCOUNTER — Encounter: Payer: Self-pay | Admitting: Nurse Practitioner

## 2024-04-18 VITALS — BP 115/54 | HR 68 | Temp 97.3°F | Wt 149.0 lb

## 2024-04-18 DIAGNOSIS — R5382 Chronic fatigue, unspecified: Secondary | ICD-10-CM

## 2024-04-18 DIAGNOSIS — I10 Essential (primary) hypertension: Secondary | ICD-10-CM

## 2024-04-18 DIAGNOSIS — E669 Obesity, unspecified: Secondary | ICD-10-CM | POA: Insufficient documentation

## 2024-04-18 DIAGNOSIS — H8110 Benign paroxysmal vertigo, unspecified ear: Secondary | ICD-10-CM

## 2024-04-18 DIAGNOSIS — G8929 Other chronic pain: Secondary | ICD-10-CM | POA: Insufficient documentation

## 2024-04-18 DIAGNOSIS — R140 Abdominal distension (gaseous): Secondary | ICD-10-CM | POA: Insufficient documentation

## 2024-04-18 DIAGNOSIS — R1032 Left lower quadrant pain: Secondary | ICD-10-CM

## 2024-04-18 DIAGNOSIS — E785 Hyperlipidemia, unspecified: Secondary | ICD-10-CM | POA: Insufficient documentation

## 2024-04-18 DIAGNOSIS — E559 Vitamin D deficiency, unspecified: Secondary | ICD-10-CM | POA: Insufficient documentation

## 2024-04-18 DIAGNOSIS — N760 Acute vaginitis: Secondary | ICD-10-CM | POA: Insufficient documentation

## 2024-04-18 DIAGNOSIS — I959 Hypotension, unspecified: Secondary | ICD-10-CM | POA: Insufficient documentation

## 2024-04-18 MED ORDER — LISINOPRIL 10 MG PO TABS: 10.0000 mg | ORAL_TABLET | Freq: Every day | ORAL | 1 refills | Status: DC

## 2024-04-18 MED ORDER — MECLIZINE HCL 25 MG PO TABS: 25.0000 mg | ORAL_TABLET | Freq: Three times a day (TID) | ORAL | 0 refills | Status: DC | PRN

## 2024-04-18 MED ORDER — ATORVASTATIN CALCIUM 20 MG PO TABS
20.0000 mg | ORAL_TABLET | Freq: Every day | ORAL | 1 refills | Status: DC
Start: 2024-04-18 — End: 2024-06-24

## 2024-04-18 MED ORDER — MECLIZINE HCL 25 MG PO TABS: 25.0000 mg | ORAL_TABLET | Freq: Three times a day (TID) | ORAL | 2 refills | Status: AC | PRN

## 2024-04-18 NOTE — Assessment & Plan Note (Signed)
 Encouraged to avoid foods that causes bloating, patient referred to GI

## 2024-04-18 NOTE — Progress Notes (Signed)
 Established Patient Office Visit  Subjective:  Patient ID: Alexis Sloan, female    DOB: 1956/07/22  Age: 68 y.o. MRN: [id number]  CC:  Chief Complaint  Patient presents with   Hypertension   Hyperlipidemia    HPI Alexis Sloan is a 68 y.o. female  has a past medical history of BPPV (benign paroxysmal positional vertigo), Hyperlipidemia, Hypertension, and Prediabetes.  Patient presents for follow-up for her chronic medical conditions  Hypertension.  Currently on amlodipine  5 mg daily, lisinopril  20 mg daily.  She denies chest pain, shortness of breath, edema.  Vertigo.  Has a prescription for meclizine  but she has been out of the medication, she continues to have dizziness with changing positions, stated that she feels better when she takes meclizine   Acute vaginitis/rectal itching.  Patient complains of vaginal itching, rectal itching that started about a week ago.,  She denies vaginal discharge, rashes, no history of hemorrhoids  Chronic fatigue .patient complains of chronic fatigue.  No fever, chest pain, shortness of breath, palpitations  Chronic abdominal pain/bloating.  Patient complains of abdominal pain and bloating.  States that her symptoms are intermittent, has constipation but takes a tea that helps her move  her bowel every day. No nausea, vomiting, hematochezia, melena,bloody stool.   Interpretation services provided by a medical interpreter   Past Medical History:  Diagnosis Date   BPPV (benign paroxysmal positional vertigo)    Hyperlipidemia    Hypertension    Prediabetes     Past Surgical History:  Procedure Laterality Date   HYSTEROTOMY      History reviewed. No pertinent family history.  Social History   Socioeconomic History   Marital status: Single    Spouse name: Not on file   Number of children: 2   Years of education: Not on file   Highest education level: Not on file  Occupational History   Not on file  Tobacco  Use   Smoking status: Never    Passive exposure: Never   Smokeless tobacco: Never  Vaping Use   Vaping status: Never Used  Substance and Sexual Activity   Alcohol use: Never   Drug use: Never   Sexual activity: Not on file  Other Topics Concern   Not on file  Social History Narrative   Lives with her son.    Social Drivers of Corporate investment banker Strain: Not on file  Food Insecurity: Patient Declined (07/14/2023)   Hunger Vital Sign    Worried About Running Out of Food in the Last Year: Patient declined    Ran Out of Food in the Last Year: Patient declined  Transportation Needs: Not on file  Physical Activity: Not on file  Stress: Not on file  Social Connections: Not on file  Intimate Partner Violence: Not on file    Outpatient Medications Prior to Visit  Medication Sig Dispense Refill   amLODipine  (NORVASC ) 5 MG tablet Take 1 tablet (5 mg total) by mouth daily. 90 tablet 1   atorvastatin  (LIPITOR) 20 MG tablet Take 1 tablet (20 mg total) by mouth daily. 90 tablet 1   lisinopril  (ZESTRIL ) 20 MG tablet Take 1 tablet (20 mg total) by mouth daily. 90 tablet 3   ibuprofen  (ADVIL ) 600 MG tablet Take 1 tablet (600 mg total) by mouth every 8 (eight) hours as needed. (Patient not taking: Reported on 04/18/2024) 30 tablet 0   meclizine  (ANTIVERT ) 25 MG tablet Take 1 tablet (25 mg total) by mouth every 8 (  eight) hours as needed. (Patient not taking: Reported on 04/18/2024) 30 tablet 0   No facility-administered medications prior to visit.    No Known Allergies  ROS Review of Systems  Constitutional:  Positive for fatigue. Negative for appetite change, chills and fever.  HENT:  Negative for congestion, postnasal drip, rhinorrhea and sneezing.   Respiratory:  Negative for cough, shortness of breath and wheezing.   Cardiovascular:  Negative for chest pain, palpitations and leg swelling.  Gastrointestinal:  Negative for abdominal pain, constipation, nausea and vomiting.   Genitourinary:  Negative for difficulty urinating, dysuria, flank pain, frequency and genital sores.  Musculoskeletal:  Negative for arthralgias, back pain, joint swelling and myalgias.  Skin:  Negative for color change, pallor, rash and wound.  Neurological:  Positive for dizziness. Negative for facial asymmetry, numbness and headaches.  Psychiatric/Behavioral:  Negative for behavioral problems, confusion, self-injury and suicidal ideas.       Objective:    Physical Exam Vitals and nursing note reviewed. Exam conducted with a chaperone present.  Constitutional:      General: She is not in acute distress.    Appearance: Normal appearance. She is obese. She is not ill-appearing, toxic-appearing or diaphoretic.  HENT:     Mouth/Throat:     Mouth: Mucous membranes are moist.     Pharynx: Oropharynx is clear. No oropharyngeal exudate or posterior oropharyngeal erythema.  Eyes:     General: No scleral icterus.       Right eye: No discharge.        Left eye: No discharge.     Extraocular Movements: Extraocular movements intact.     Conjunctiva/sclera: Conjunctivae normal.  Cardiovascular:     Rate and Rhythm: Normal rate and regular rhythm.     Pulses: Normal pulses.     Heart sounds: Normal heart sounds. No murmur heard.    No friction rub. No gallop.  Pulmonary:     Effort: Pulmonary effort is normal. No respiratory distress.     Breath sounds: Normal breath sounds. No stridor. No wheezing, rhonchi or rales.  Chest:     Chest wall: No tenderness.  Abdominal:     General: There is no distension.     Palpations: Abdomen is soft.     Tenderness: There is abdominal tenderness. There is no right CVA tenderness, left CVA tenderness or guarding.     Comments: Left lower quadrant  Genitourinary:    Exam position: Lithotomy position.     Pubic Area: No rash or pubic lice.      Tanner stage (genital): 5.     Labia:        Right: Tenderness present. No rash, lesion or injury.         Left: Tenderness present. No rash, lesion or injury.      Vagina: Vaginal discharge present. No erythema or lesions.     Rectum: No external hemorrhoid.     Comments: Thick white discharge noted  Musculoskeletal:        General: No swelling, tenderness, deformity or signs of injury.     Right lower leg: No edema.     Left lower leg: No edema.  Skin:    General: Skin is warm and dry.     Capillary Refill: Capillary refill takes less than 2 seconds.     Coloration: Skin is not jaundiced or pale.     Findings: No bruising, erythema or lesion.  Neurological:     Mental Status: She is alert  and oriented to person, place, and time.     Motor: No weakness.     Coordination: Coordination normal.     Gait: Gait abnormal.  Psychiatric:        Mood and Affect: Mood normal.        Behavior: Behavior normal.        Thought Content: Thought content normal.        Judgment: Judgment normal.     BP (!) 115/54 Comment: sitting  Pulse 68   Temp (!) 97.3 F (36.3 C)   Wt 149 lb (67.6 kg)   SpO2 98%   BMI 30.61 kg/m  Wt Readings from Last 3 Encounters:  04/18/24 149 lb (67.6 kg)  01/18/24 151 lb (68.5 kg)  09/18/23 149 lb 9.6 oz (67.9 kg)    Lab Results  Component Value Date   TSH 2.050 07/14/2023   Lab Results  Component Value Date   WBC 9.8 07/14/2023   HGB 14.1 07/14/2023   HCT 41.1 07/14/2023   MCV 87.4 07/14/2023   PLT 269 07/14/2023   Lab Results  Component Value Date   NA 142 01/19/2024   K 4.6 01/19/2024   CO2 25 01/19/2024   GLUCOSE 97 01/19/2024   BUN 8 01/19/2024   CREATININE 0.75 01/19/2024   BILITOT 0.3 01/19/2024   ALKPHOS 126 (H) 01/19/2024   AST 17 01/19/2024   ALT 16 01/19/2024   PROT 7.1 01/19/2024   ALBUMIN 3.7 (L) 01/19/2024   CALCIUM  8.9 01/19/2024   ANIONGAP 6 07/16/2023   EGFR 87 01/19/2024   Lab Results  Component Value Date   CHOL 156 01/19/2024   Lab Results  Component Value Date   HDL 46 01/19/2024   Lab Results  Component Value  Date   LDLCALC 87 01/19/2024   Lab Results  Component Value Date   TRIG 129 01/19/2024   Lab Results  Component Value Date   CHOLHDL 3.4 01/19/2024   Lab Results  Component Value Date   HGBA1C 6.1 (H) 07/14/2023      Assessment & Plan:   Problem List Items Addressed This Visit       Cardiovascular and Mediastinum   Essential hypertension - Primary   Blood pressure is soft, due to complaints of persistent intermittent dizziness will decrease lisinopril  to 10 mg daily. continue amlodipine  5 mg daily decrease lisinopril  to 10 mg daily Blood pressure goal is less than 130/80 Monitor blood pressure keep a log and follow-up in 4 weeks DASH diet and commitment to daily physical activity for a minimum of 30 minutes discussed and encouraged, as a part of hypertension management. The importance of attaining a healthy weight is also discussed.     04/18/2024    1:30 PM 04/18/2024    1:10 PM 01/18/2024    3:17 PM 09/18/2023    3:27 PM 08/21/2023    2:04 PM 07/16/2023   12:31 PM 07/16/2023    5:22 AM  BP/Weight  Systolic BP 115  409 121 127 115 107  Diastolic BP 54  65 70 65 75 68  Wt. (Lbs)  149 151 149.6 151.8    BMI  30.61 kg/m2 31.02 kg/m2 30.73 kg/m2 31.19 kg/m2             Relevant Medications   lisinopril  (ZESTRIL ) 10 MG tablet   atorvastatin  (LIPITOR) 20 MG tablet   Other Relevant Orders   CMP14+EGFR     Nervous and Auditory   BPPV (benign paroxysmal positional vertigo)  Meclizine  25 mg 3 times daily as needed ordered Patient referred to physical therapy for vestibular therapy Avoid changing positions abruptly to minimize dizziness Fall prevention education discussed      Relevant Medications   meclizine  (ANTIVERT ) 25 MG tablet   Other Relevant Orders   PT vestibular rehab   Ambulatory referral to Physical Therapy     Genitourinary   Acute vaginitis    - NuSwab Vaginitis Plus (VG+)       Relevant Orders   NuSwab Vaginitis Plus (VG+)     Other    Chronic fatigue   Checking CBC, BMP, vitamin D  Encouraged to drink at least 64 ounces of water daily to maintain hydration Encouraged to engage in regular moderate exercises at least 150 minutes weekly as tolerated      Relevant Orders   CBC   Obesity (BMI 30-39.9)   Wt Readings from Last 3 Encounters:  04/18/24 149 lb (67.6 kg)  01/18/24 151 lb (68.5 kg)  09/18/23 149 lb 9.6 oz (67.9 kg)   Body mass index is 30.61 kg/m.  Patient counseled on low-carb diet Encouraged to engage in regular moderate exercises at least 150 minutes weekly as tolerated      Dyslipidemia, goal LDL below 100   Relevant Medications   lisinopril  (ZESTRIL ) 10 MG tablet   atorvastatin  (LIPITOR) 20 MG tablet   Vitamin D  deficiency   Last vitamin D  Lab Results  Component Value Date   VD25OH 26.61 (L) 07/14/2023  Not on vitamin D  supplement Checking labs      Relevant Orders   VITAMIN D  25 Hydroxy (Vit-D Deficiency, Fractures)   Abdominal pain, chronic, left lower quadrant   Patient referred to GI      Relevant Orders   Ambulatory referral to Gastroenterology   Bloating   Encouraged to avoid foods that causes bloating, patient referred to GI      Relevant Orders   Ambulatory referral to Gastroenterology    Meds ordered this encounter  Medications   lisinopril  (ZESTRIL ) 10 MG tablet    Sig: Take 1 tablet (10 mg total) by mouth daily.    Dispense:  90 tablet    Refill:  1   atorvastatin  (LIPITOR) 20 MG tablet    Sig: Take 1 tablet (20 mg total) by mouth daily.    Dispense:  90 tablet    Refill:  1   DISCONTD: meclizine  (ANTIVERT ) 25 MG tablet    Sig: Take 1 tablet (25 mg total) by mouth every 8 (eight) hours as needed.    Dispense:  30 tablet    Refill:  0   meclizine  (ANTIVERT ) 25 MG tablet    Sig: Take 1 tablet (25 mg total) by mouth every 8 (eight) hours as needed.    Dispense:  30 tablet    Refill:  2    Follow-up: Return in about 4 weeks (around 05/16/2024) for HTN.     Decoda Van R Socorro Ebron, FNP

## 2024-04-18 NOTE — Assessment & Plan Note (Signed)
 Checking CBC, BMP, vitamin D  Encouraged to drink at least 64 ounces of water daily to maintain hydration Encouraged to engage in regular moderate exercises at least 150 minutes weekly as tolerated

## 2024-04-18 NOTE — Assessment & Plan Note (Addendum)
 Wt Readings from Last 3 Encounters:  04/18/24 149 lb (67.6 kg)  01/18/24 151 lb (68.5 kg)  09/18/23 149 lb 9.6 oz (67.9 kg)   Body mass index is 30.61 kg/m.  Patient counseled on low-carb diet Encouraged to engage in regular moderate exercises at least 150 minutes weekly as tolerated

## 2024-04-18 NOTE — Assessment & Plan Note (Signed)
-

## 2024-04-18 NOTE — Assessment & Plan Note (Addendum)
 Meclizine  25 mg 3 times daily as needed ordered Patient referred to physical therapy for vestibular therapy Avoid changing positions abruptly to minimize dizziness Fall prevention education discussed

## 2024-04-18 NOTE — Assessment & Plan Note (Signed)
 Last vitamin D  Lab Results  Component Value Date   VD25OH 26.61 (L) 07/14/2023  Not on vitamin D  supplement Checking labs

## 2024-04-18 NOTE — Assessment & Plan Note (Signed)
 Patient referred to GI.

## 2024-04-18 NOTE — Assessment & Plan Note (Addendum)
 Blood pressure is soft, due to complaints of persistent intermittent dizziness will decrease lisinopril  to 10 mg daily. continue amlodipine  5 mg daily decrease lisinopril  to 10 mg daily Blood pressure goal is less than 130/80 Monitor blood pressure keep a log and follow-up in 4 weeks DASH diet and commitment to daily physical activity for a minimum of 30 minutes discussed and encouraged, as a part of hypertension management. The importance of attaining a healthy weight is also discussed.     04/18/2024    1:30 PM 04/18/2024    1:10 PM 01/18/2024    3:17 PM 09/18/2023    3:27 PM 08/21/2023    2:04 PM 07/16/2023   12:31 PM 07/16/2023    5:22 AM  BP/Weight  Systolic BP 115  102 121 127 115 107  Diastolic BP 54  65 70 65 75 68  Wt. (Lbs)  149 151 149.6 151.8    BMI  30.61 kg/m2 31.02 kg/m2 30.73 kg/m2 31.19 kg/m2

## 2024-04-18 NOTE — Patient Instructions (Signed)
 Around 3 times per week, check your blood pressure 2 times per day. once in the morning and once in the evening. The readings should be at least one minute apart. Write down these values and bring them to your next nurse visit/appointment.  When you check your BP, make sure you have been doing something calm/relaxing 5 minutes prior to checking. Both feet should be flat on the floor and you should be sitting. Use your left arm and make sure it is in a relaxed position (on a table), and that the cuff is at the approximate level/height of your heart.      . Essential hypertension (Primary)  - lisinopril  (ZESTRIL ) 10 MG tablet; Take 1 tablet (10 mg total) by mouth daily.  Dispense: 90 tablet; Refill: 1 - [id number]+EGFR   . Abdominal pain, chronic, left lower quadrant  - Ambulatory referral to Gastroenterology  . Bloating  - Ambulatory referral to Gastroenterology  . Benign paroxysmal positional vertigo, unspecified laterality  - Ambulatory referral to Physical Therapy  . Dyslipidemia, goal LDL below 100  - atorvastatin  (LIPITOR) 20 MG tablet; Take 1 tablet (20 mg total) by mouth daily.  Dispense: 90 tablet; Refill: 1

## 2024-04-19 LAB — CBC
Hematocrit: 37.7 % (ref 34.0–46.6)
Hemoglobin: 12.4 g/dL (ref 11.1–15.9)
MCH: 30.2 pg (ref 26.6–33.0)
MCHC: 32.9 g/dL (ref 31.5–35.7)
MCV: 92 fL (ref 79–97)
Platelets: 249 10*3/uL (ref 150–450)
RBC: 4.1 x10E6/uL (ref 3.77–5.28)
RDW: 12.8 % (ref 11.7–15.4)
WBC: 8.3 10*3/uL (ref 3.4–10.8)

## 2024-04-19 LAB — CMP14+EGFR
ALT: 25 IU/L (ref 0–32)
AST: 21 IU/L (ref 0–40)
Albumin: 4.4 g/dL (ref 3.9–4.9)
Alkaline Phosphatase: 69 IU/L (ref 44–121)
BUN/Creatinine Ratio: 15 (ref 12–28)
BUN: 13 mg/dL (ref 8–27)
Bilirubin Total: <0.2 mg/dL (ref 0.0–1.2)
CO2: 24 mmol/L (ref 20–29)
Calcium: 9.5 mg/dL (ref 8.7–10.3)
Chloride: 103 mmol/L (ref 96–106)
Creatinine, Ser: 0.84 mg/dL (ref 0.57–1.00)
Globulin, Total: 3 g/dL (ref 1.5–4.5)
Glucose: 99 mg/dL (ref 70–99)
Potassium: 4.4 mmol/L (ref 3.5–5.2)
Sodium: 142 mmol/L (ref 134–144)
Total Protein: 7.4 g/dL (ref 6.0–8.5)
eGFR: 76 mL/min/{1.73_m2} (ref >59)

## 2024-04-19 LAB — VITAMIN D 25 HYDROXY (VIT D DEFICIENCY, FRACTURES): Vit D, 25-Hydroxy: 34.1 ng/mL (ref 30.0–100.0)

## 2024-04-20 ENCOUNTER — Other Ambulatory Visit: Payer: Self-pay | Admitting: Nurse Practitioner

## 2024-04-20 DIAGNOSIS — B9689 Other specified bacterial agents as the cause of diseases classified elsewhere: Secondary | ICD-10-CM

## 2024-04-20 LAB — NUSWAB VAGINITIS PLUS (VG+)
Atopobium vaginae: HIGH {score} — AB
BVAB 2: HIGH {score} — AB
Candida albicans, NAA: NEGATIVE
Candida glabrata, NAA: NEGATIVE
Chlamydia trachomatis, NAA: NEGATIVE
Megasphaera 1: HIGH {score} — AB
Neisseria gonorrhoeae, NAA: NEGATIVE
Trich vag by NAA: NEGATIVE

## 2024-04-20 MED ORDER — METRONIDAZOLE 500 MG PO TABS: 500.0000 mg | ORAL_TABLET | Freq: Two times a day (BID) | ORAL | 0 refills | Status: AC

## 2024-05-17 ENCOUNTER — Encounter: Payer: Self-pay | Admitting: Nurse Practitioner

## 2024-05-18 ENCOUNTER — Ambulatory Visit: Payer: Self-pay | Admitting: Nurse Practitioner

## 2024-06-09 ENCOUNTER — Inpatient Hospital Stay: Admission: RE | Admit: 2024-06-09 | Payer: Self-pay | Source: Ambulatory Visit

## 2024-06-24 ENCOUNTER — Ambulatory Visit (INDEPENDENT_AMBULATORY_CARE_PROVIDER_SITE_OTHER): Payer: Self-pay | Admitting: Nurse Practitioner

## 2024-06-24 ENCOUNTER — Encounter: Payer: Self-pay | Admitting: Nurse Practitioner

## 2024-06-24 VITALS — BP 136/75 | HR 62 | Ht 58.5 in | Wt 150.0 lb

## 2024-06-24 DIAGNOSIS — T7840XA Allergy, unspecified, initial encounter: Secondary | ICD-10-CM | POA: Insufficient documentation

## 2024-06-24 DIAGNOSIS — I10 Essential (primary) hypertension: Secondary | ICD-10-CM

## 2024-06-24 DIAGNOSIS — E785 Hyperlipidemia, unspecified: Secondary | ICD-10-CM

## 2024-06-24 DIAGNOSIS — R1032 Left lower quadrant pain: Secondary | ICD-10-CM

## 2024-06-24 DIAGNOSIS — R42 Dizziness and giddiness: Secondary | ICD-10-CM

## 2024-06-24 DIAGNOSIS — G8929 Other chronic pain: Secondary | ICD-10-CM

## 2024-06-24 DIAGNOSIS — R051 Acute cough: Secondary | ICD-10-CM | POA: Insufficient documentation

## 2024-06-24 MED ORDER — BENZONATATE 100 MG PO CAPS: 100.0000 mg | ORAL_CAPSULE | Freq: Three times a day (TID) | ORAL | 0 refills | Status: DC | PRN

## 2024-06-24 MED ORDER — AMLODIPINE BESYLATE 5 MG PO TABS: 10.0000 mg | ORAL_TABLET | Freq: Every day | ORAL | 3 refills | Status: DC

## 2024-06-24 MED ORDER — LORATADINE 10 MG PO TABS: 10.0000 mg | ORAL_TABLET | Freq: Every day | ORAL | 11 refills | Status: AC | PRN

## 2024-06-24 MED ORDER — ATORVASTATIN CALCIUM 20 MG PO TABS: 20.0000 mg | ORAL_TABLET | Freq: Every day | ORAL | 1 refills | Status: DC

## 2024-06-24 NOTE — Assessment & Plan Note (Signed)
 She currently denies dizziness Takes meclizine  25 mg 3 times daily as needed

## 2024-06-24 NOTE — Assessment & Plan Note (Signed)
 Lab Results  Component Value Date   CHOL 156 01/19/2024   HDL 46 01/19/2024   LDLCALC 87 01/19/2024   LDLDIRECT 118 (H) 09/18/2023   TRIG 129 01/19/2024   CHOLHDL 3.4 01/19/2024  Controlled on atorvastatin  20 mg daily Continue current medication

## 2024-06-24 NOTE — Assessment & Plan Note (Signed)
 Start Claritin 10 mg daily as needed Avoid allergens

## 2024-06-24 NOTE — Patient Instructions (Addendum)
 [facility] [address] [place] [address]  P:  [phone] F:  [phone]  Please call the gastroenterologist on [phone] to schedule an appointment for your abdominal pain   1. Allergy, initial encounter (Primary)  - loratadine (CLARITIN) 10 MG tablet; Take 1 tablet (10 mg total) by mouth daily as needed for allergies.  Dispense: 30 tablet; Refill: 11  2. Acute cough  - benzonatate (TESSALON) 100 MG capsule; Take 1 capsule (100 mg total) by mouth 3 (three) times daily as needed for cough.  Dispense: 20 capsule; Refill: 0  3. Essential hypertension  - amLODipine  (NORVASC ) 5 MG tablet; Take 2 tablets (10 mg total) by mouth daily.  Dispense: 120 tablet; Refill: 3  4. Dyslipidemia, goal LDL below 100  - atorvastatin  (LIPITOR) 20 MG tablet; Take 1 tablet (20 mg total) by mouth daily.  Dispense: 90 tablet; Refill: 1    1. Alergia, consulta inicial (Primaria)  - Loratadina (CLARITIN) tableta de 10 mg; Tomar 1 tableta (10 mg en total) por va oral al da, segn sea necesario para la alergia. Dispensacin: 30 tabletas; Resurtido: 11  2. Tos aguda  - Benzonatato (TESSALON) cpsula de 100 mg; Tomar 1 cpsula (100 mg en total) por va oral 3 (tres) veces al da, segn sea necesario para la tos. Dispensacin: 20 cpsulas; Resurtido: 0  3. Hipertensin esencial  - AmLODipino (NORVASC ) tableta de 5 mg; Tomar 2 tabletas (10 mg en total) por va oral al da. Dispensacin: 120 tabletas; Resurtido: 3  4. Dislipidemia, objetivo de LDL por debajo de 100  - Atorvastatina (LIPITOR) tableta de 20 mg; Tomar 1 tableta (20 mg en total) por va oral al da. Dispensacin: 90 tabletas; Recarga: 1    Es importante que haga ejercicio regularmente, al menos 30 minutos 5 veces por semana, segn su tolerancia. Piense en lo que va a comer y planifique con anticipacin. Elija alimentos limpios, verdes, frescos o congelados en lugar de alimentos  enlatados, procesados o envasados, que son ms azucarados, salados y Scientist, physiological. Entre el 70 y el 75 % de su consumo debe ser de verduras y frutas. Tres comidas a horarios fijos, con refrigerios permitidos entre comidas, pero deben ser frutas o verduras. Intente comer durante un perodo de 12 horas, por ejemplo, de 7:00 a. m. a 7:00 p. m., y DETNGASE despus de su ltima comida del da. Renea schmitz, generalmente unos 1 litro al da; ninguna otra bebida es tan saludable. El jugo de fruta se disfruta mejor de forma saludable, COMIENDO la fruta.  Kathlynn pound [facility]; consideramos un privilegio servirle.

## 2024-06-24 NOTE — Assessment & Plan Note (Signed)
 Since blood pressure is well-controlled on amlodipine  10 mg daily,will continue amlodipine  10 mg daily, lisinopril  discontinued Encouraged to report leg edema DASH diet and commitment to daily physical activity for a minimum of 30 minutes discussed and encouraged, as a part of hypertension management. The importance of attaining a healthy weight is also discussed.     06/24/2024    2:29 PM 06/24/2024    2:16 PM 04/18/2024    1:30 PM 04/18/2024    1:10 PM 01/18/2024    3:17 PM 09/18/2023    3:27 PM 08/21/2023    2:04 PM  BP/Weight  Systolic BP 136 141 115  115 121 127  Diastolic BP 75 74 54  65 70 65  Wt. (Lbs)  150  149 151 149.6 151.8  BMI  30.82 kg/m2  30.61 kg/m2 31.02 kg/m2 30.73 kg/m2 31.19 kg/m2

## 2024-06-24 NOTE — Assessment & Plan Note (Signed)
 Start Tessalon 100 mg 3 times daily as needed

## 2024-06-24 NOTE — Assessment & Plan Note (Signed)
Patient currently denies abdominal pain.

## 2024-06-24 NOTE — Progress Notes (Signed)
 Established Patient Office Visit  Subjective:  Patient ID: Alexis Sloan, female    DOB: 12/31/1955  Age: 68 y.o. MRN: [id number]  CC:  Chief Complaint  Patient presents with   Hypertension    Bronchitis     HPI Alexis Sloan is a 68 y.o. female  has a past medical history of BPPV (benign paroxysmal positional vertigo), Hyperlipidemia, Hypertension, and Prediabetes.  Patient presents for follow-up for her chronic medical conditions  Hypertension.  Currently taking amlodipine  10 mg daily(2 tablets of 5 mg) she has not been taking lisinopril .  She currently denies chest pain, shortness of breath, edema.  Hyperlipidemia atorvastatin  20 mg daily, no complaints of muscle cramps   Allergies.  Patient complains of nonproductive cough, nasal congestion, sneezing, itchy throat that started a week ago.  Itchy throat has since resolved still has a little nasal congestion, sneezing and nonproductive cough.  Taking an OTC cough medication that helps a little bit.  She denies fever, chills, body aches, wheezing, runny nose  She stated that her abdominal pain and dizziness have since resolved Interpretation services provided by a medical interpreter  Past Medical History:  Diagnosis Date   BPPV (benign paroxysmal positional vertigo)    Hyperlipidemia    Hypertension    Prediabetes     Past Surgical History:  Procedure Laterality Date   HYSTEROTOMY      History reviewed. No pertinent family history.  Social History   Socioeconomic History   Marital status: Single    Spouse name: Not on file   Number of children: 2   Years of education: Not on file   Highest education level: Not on file  Occupational History   Not on file  Tobacco Use   Smoking status: Never    Passive exposure: Never   Smokeless tobacco: Never  Vaping Use   Vaping status: Never Used  Substance and Sexual Activity   Alcohol use: Never   Drug use: Never   Sexual activity: Not on  file  Other Topics Concern   Not on file  Social History Narrative   Lives with her son.    Social Drivers of Corporate investment banker Strain: Not on file  Food Insecurity: Patient Declined (07/14/2023)   Hunger Vital Sign    Worried About Running Out of Food in the Last Year: Patient declined    Ran Out of Food in the Last Year: Patient declined  Transportation Needs: Not on file  Physical Activity: Not on file  Stress: Not on file  Social Connections: Not on file  Intimate Partner Violence: Not on file    Outpatient Medications Prior to Visit  Medication Sig Dispense Refill   meclizine  (ANTIVERT ) 25 MG tablet Take 1 tablet (25 mg total) by mouth every 8 (eight) hours as needed. 30 tablet 2   amLODipine  (NORVASC ) 5 MG tablet Take 1 tablet (5 mg total) by mouth daily. (Patient taking differently: Take 1 tablet (5 mg total) by mouth daily.) 90 tablet 1   atorvastatin  (LIPITOR) 20 MG tablet Take 1 tablet (20 mg total) by mouth daily. 90 tablet 1   ibuprofen  (ADVIL ) 600 MG tablet Take 1 tablet (600 mg total) by mouth every 8 (eight) hours as needed. (Patient not taking: Reported on 06/24/2024) 30 tablet 0   lisinopril  (ZESTRIL ) 10 MG tablet Take 1 tablet (10 mg total) by mouth daily. (Patient not taking: Reported on 06/24/2024) 90 tablet 1   No facility-administered medications prior to visit.  No Known Allergies  ROS Review of Systems  Constitutional:  Negative for appetite change, chills, fatigue and fever.  HENT:  Positive for congestion and sneezing. Negative for postnasal drip.   Respiratory:  Positive for cough. Negative for shortness of breath and wheezing.   Cardiovascular:  Negative for chest pain, palpitations and leg swelling.  Gastrointestinal:  Negative for abdominal pain, constipation, nausea and vomiting.  Genitourinary:  Negative for difficulty urinating, dysuria, flank pain and frequency.  Musculoskeletal:  Negative for arthralgias, back pain, joint swelling  and myalgias.  Skin:  Negative for color change, pallor and rash.  Neurological:  Negative for dizziness, facial asymmetry, weakness, numbness and headaches.  Psychiatric/Behavioral:  Negative for behavioral problems, confusion, self-injury and suicidal ideas.       Objective:    Physical Exam Vitals and nursing note reviewed.  Constitutional:      General: She is not in acute distress.    Appearance: Normal appearance. She is obese. She is not ill-appearing, toxic-appearing or diaphoretic.   Eyes:     General: No scleral icterus.       Right eye: No discharge.        Left eye: No discharge.     Extraocular Movements: Extraocular movements intact.     Conjunctiva/sclera: Conjunctivae normal.    Cardiovascular:     Rate and Rhythm: Normal rate and regular rhythm.     Pulses: Normal pulses.     Heart sounds: Normal heart sounds. No murmur heard.    No friction rub. No gallop.  Pulmonary:     Effort: Pulmonary effort is normal. No respiratory distress.     Breath sounds: Normal breath sounds. No stridor. No wheezing, rhonchi or rales.  Chest:     Chest wall: No tenderness.  Abdominal:     General: There is no distension.     Palpations: Abdomen is soft.     Tenderness: There is no abdominal tenderness. There is no right CVA tenderness, left CVA tenderness or guarding.   Musculoskeletal:        General: No swelling, tenderness, deformity or signs of injury.     Right lower leg: No edema.     Left lower leg: No edema.   Skin:    General: Skin is warm and dry.     Capillary Refill: Capillary refill takes less than 2 seconds.     Coloration: Skin is not jaundiced or pale.     Findings: No bruising, erythema or lesion.   Neurological:     Mental Status: She is alert and oriented to person, place, and time.     Motor: No weakness.     Coordination: Coordination normal.     Gait: Gait normal.   Psychiatric:        Mood and Affect: Mood normal.        Behavior: Behavior  normal.        Thought Content: Thought content normal.        Judgment: Judgment normal.     BP 136/75   Pulse 62   Ht 4' 10.5 (1.486 m)   Wt 150 lb (68 kg)   SpO2 97%   BMI 30.82 kg/m  Wt Readings from Last 3 Encounters:  06/24/24 150 lb (68 kg)  04/18/24 149 lb (67.6 kg)  01/18/24 151 lb (68.5 kg)    Lab Results  Component Value Date   TSH 2.050 07/14/2023   Lab Results  Component Value Date   WBC 8.3  04/18/2024   HGB 12.4 04/18/2024   HCT 37.7 04/18/2024   MCV 92 04/18/2024   PLT 249 04/18/2024   Lab Results  Component Value Date   NA 142 04/18/2024   K 4.4 04/18/2024   CO2 24 04/18/2024   GLUCOSE 99 04/18/2024   BUN 13 04/18/2024   CREATININE 0.84 04/18/2024   BILITOT <0.2 04/18/2024   ALKPHOS 69 04/18/2024   AST 21 04/18/2024   ALT 25 04/18/2024   PROT 7.4 04/18/2024   ALBUMIN 4.4 04/18/2024   CALCIUM  9.5 04/18/2024   ANIONGAP 6 07/16/2023   EGFR 76 04/18/2024   Lab Results  Component Value Date   CHOL 156 01/19/2024   Lab Results  Component Value Date   HDL 46 01/19/2024   Lab Results  Component Value Date   LDLCALC 87 01/19/2024   Lab Results  Component Value Date   TRIG 129 01/19/2024   Lab Results  Component Value Date   CHOLHDL 3.4 01/19/2024   Lab Results  Component Value Date   HGBA1C 6.1 (H) 07/14/2023      Assessment & Plan:   Problem List Items Addressed This Visit       Cardiovascular and Mediastinum   Essential hypertension   Since blood pressure is well-controlled on amlodipine  10 mg daily,will continue amlodipine  10 mg daily, lisinopril  discontinued Encouraged to report leg edema DASH diet and commitment to daily physical activity for a minimum of 30 minutes discussed and encouraged, as a part of hypertension management. The importance of attaining a healthy weight is also discussed.     06/24/2024    2:29 PM 06/24/2024    2:16 PM 04/18/2024    1:30 PM 04/18/2024    1:10 PM 01/18/2024    3:17 PM 09/18/2023     3:27 PM 08/21/2023    2:04 PM  BP/Weight  Systolic BP 136 141 115  115 121 127  Diastolic BP 75 74 54  65 70 65  Wt. (Lbs)  150  149 151 149.6 151.8  BMI  30.82 kg/m2  30.61 kg/m2 31.02 kg/m2 30.73 kg/m2 31.19 kg/m2           Relevant Medications   amLODipine  (NORVASC ) 5 MG tablet   atorvastatin  (LIPITOR) 20 MG tablet     Other   Dizziness   She currently denies dizziness Takes meclizine  25 mg 3 times daily as needed      Dyslipidemia, goal LDL below 100   Lab Results  Component Value Date   CHOL 156 01/19/2024   HDL 46 01/19/2024   LDLCALC 87 01/19/2024   LDLDIRECT 118 (H) 09/18/2023   TRIG 129 01/19/2024   CHOLHDL 3.4 01/19/2024  Controlled on atorvastatin  20 mg daily Continue current medication      Relevant Medications   amLODipine  (NORVASC ) 5 MG tablet   atorvastatin  (LIPITOR) 20 MG tablet   Abdominal pain, chronic, left lower quadrant   Patient currently denies abdominal pain      Acute cough   Start Tessalon 100 mg 3 times daily as needed      Relevant Medications   benzonatate (TESSALON) 100 MG capsule   Allergies - Primary   Start Claritin 10 mg daily as needed Avoid allergens      Relevant Medications   loratadine (CLARITIN) 10 MG tablet    Meds ordered this encounter  Medications   loratadine (CLARITIN) 10 MG tablet    Sig: Take 1 tablet (10 mg total) by mouth daily as needed for  allergies.    Dispense:  30 tablet    Refill:  11   benzonatate (TESSALON) 100 MG capsule    Sig: Take 1 capsule (100 mg total) by mouth 3 (three) times daily as needed for cough.    Dispense:  20 capsule    Refill:  0   amLODipine  (NORVASC ) 5 MG tablet    Sig: Take 2 tablets (10 mg total) by mouth daily.    Dispense:  120 tablet    Refill:  3   atorvastatin  (LIPITOR) 20 MG tablet    Sig: Take 1 tablet (20 mg total) by mouth daily.    Dispense:  90 tablet    Refill:  1    Follow-up: Return in about 4 months (around 10/24/2024) for HTN.    Bryn Saline R  Matei Magnone, FNP

## 2024-08-27 ENCOUNTER — Other Ambulatory Visit: Payer: Self-pay | Admitting: Nurse Practitioner

## 2024-08-27 DIAGNOSIS — I10 Essential (primary) hypertension: Secondary | ICD-10-CM

## 2024-10-10 ENCOUNTER — Telehealth: Payer: Self-pay

## 2024-10-10 DIAGNOSIS — I10 Essential (primary) hypertension: Secondary | ICD-10-CM

## 2024-10-10 MED ORDER — AMLODIPINE BESYLATE 5 MG PO TABS: 5.0000 mg | ORAL_TABLET | Freq: Every day | ORAL | 1 refills | Status: DC

## 2024-10-10 NOTE — Telephone Encounter (Signed)
Done Kh 

## 2024-10-24 ENCOUNTER — Ambulatory Visit: Payer: Self-pay | Admitting: Nurse Practitioner

## 2024-12-16 ENCOUNTER — Ambulatory Visit: Payer: Self-pay

## 2024-12-16 NOTE — Telephone Encounter (Signed)
 FYI Only or Action Required?: FYI only for provider: appointment scheduled on 01/02/2025.  Patient was last seen in primary care on 06/24/2024 by Paseda, Folashade R, FNP.  Called Nurse Triage reporting Foot Swelling.  Symptoms began several months ago.  Interventions attempted: OTC medications: ibuprofen ; tylenol  arthritis, Rest, hydration, or home remedies, and Ice/heat application.  Symptoms are: unchanged.  Triage Disposition: See PCP Within 2 Weeks  Patient/caregiver understands and will follow disposition?: Yes    Copied from CRM #[id number]. Topic: Clinical - Red Word Triage >> Dec 16, 2024  2:07 PM Rachelle R wrote: [facility] that prompted transfer to Nurse Triage: Patient has swelling in her right foot. Has been ongoing for the last few months and is swollen now.      Reason for Disposition  [1] MILD swelling of both ankles (i.e., pedal edema) AND [2] is a chronic symptom (recurrent or ongoing AND present > 4 weeks)  Answer Assessment - Initial Assessment Questions Pt's son and pt on speaker phone, contacted clinic to schedule f/u appt for BP; taking amlodipine  5mg  and BP has improved. Also report R foot swelling x 3 months. Pt had a fall in September but was in [place] visiting her daughter so she did not call office to f/u, denies any xrays or visit to ED at time of fall. Pt has been taking tylenol  arthritis and ibuprofen , elevating foot in the evenings, applying heat, limiting salt and wearing compression socks. Pt's son states that swelling comes and goes and is only in R foot. Pt denies pain, no fever, SOB, no swelling extending up to ankle or up leg. Educated on red flag symptoms and encouraged pt to continue regimen until appt. Appointment scheduled for evaluation. Patient agrees with plan of care, and will call back if anything changes, or if symptoms worsen.     1. ONSET: When did the swelling start? (e.g., minutes, hours, days)     Pt's son reports swelling has been  recurrent since pt fell in September   2. LOCATION: What part of the leg is swollen?  Are both legs swollen or just one leg?     R foot; swelling is unilateral and does not extend up to ankle or leg   3. SEVERITY: How bad is the swelling? (e.g., localized; mild, moderate, severe)     Mild-moderate at the end of the day   4. REDNESS: Is there redness or signs of infection?     None  5. PAIN: Is the swelling painful to touch? If Yes, ask: How painful is it?   (Scale 1-10; mild, moderate or severe)     Denies pain   6. FEVER: Do you have a fever? If Yes, ask: What is it, how was it measured, and when did it start?      No   7. CAUSE: What do you think is causing the leg swelling?     Unknown; suspects r/t fall in September. Denies f/u or xrays d/t fall happening while pt was visiting daughter in [place]   9. RECURRENT SYMPTOM: Have you had leg swelling before? If Yes, ask: When was the last time? What happened that time?     No   10. OTHER SYMPTOMS: Do you have any other symptoms? (e.g., chest pain, difficulty breathing)       None  Protocols used: Leg Swelling and Edema-A-AH

## 2024-12-19 NOTE — Telephone Encounter (Signed)
 Pt was called with interpreter. No answer. Kh

## 2025-01-02 ENCOUNTER — Encounter: Payer: Self-pay | Admitting: Nurse Practitioner

## 2025-01-02 ENCOUNTER — Ambulatory Visit (INDEPENDENT_AMBULATORY_CARE_PROVIDER_SITE_OTHER): Payer: Self-pay | Admitting: Nurse Practitioner

## 2025-01-02 VITALS — BP 135/74 | HR 68 | Temp 97.3°F | Wt 159.0 lb

## 2025-01-02 DIAGNOSIS — I10 Essential (primary) hypertension: Secondary | ICD-10-CM

## 2025-01-02 DIAGNOSIS — Z789 Other specified health status: Secondary | ICD-10-CM

## 2025-01-02 DIAGNOSIS — L853 Xerosis cutis: Secondary | ICD-10-CM

## 2025-01-02 DIAGNOSIS — H8110 Benign paroxysmal vertigo, unspecified ear: Secondary | ICD-10-CM

## 2025-01-02 DIAGNOSIS — R7303 Prediabetes: Secondary | ICD-10-CM

## 2025-01-02 DIAGNOSIS — E785 Hyperlipidemia, unspecified: Secondary | ICD-10-CM

## 2025-01-02 DIAGNOSIS — Z23 Encounter for immunization: Secondary | ICD-10-CM

## 2025-01-02 DIAGNOSIS — M25471 Effusion, right ankle: Secondary | ICD-10-CM

## 2025-01-02 DIAGNOSIS — H9209 Otalgia, unspecified ear: Secondary | ICD-10-CM

## 2025-01-02 MED ORDER — AMLODIPINE BESYLATE 5 MG PO TABS: 5.0000 mg | ORAL_TABLET | Freq: Every day | ORAL | 1 refills | Status: AC

## 2025-01-02 NOTE — Assessment & Plan Note (Signed)
 Taking B12 supplement does not know the dosage Checking B12 to ensure she is not getting too much of the supplement

## 2025-01-02 NOTE — Patient Instructions (Addendum)
 Para la hinchazn en las extremidades inferiores, asegrese de elevar las piernas siempre que sea posible, controle el consumo de sal, mantngase fsicamente activo y considere usar medias de compresin.  Aproximadamente tres veces por semana, tmese la presin arterial dos veces al da: una por la maana y otra por la noche. Las mediciones deben realizarse con al menos un minuto de diferencia. Anote estos valores y llvelos a su prxima cita con la enfermera.  Al tomarse la presin arterial, asegrese de haber estado realizando alguna actividad tranquila o relajante durante los 5 minutos previos. Ambos pies deben estar apoyados en el suelo y debe estar sentado. Use el brazo izquierdo y asegrese de que est en una posicin relajada (sobre una mesa) y que el brazalete est aproximadamente a la altura del corazn. Si sus lecturas de presin arterial son consistentemente superiores a 140/90, comunquese con el consultorio.  Es importante que haga ejercicio regularmente, al menos 30 minutos, 5 veces por semana, segn su tolerancia. Piense en lo que va a comer y planifique con anticipacin. Elija alimentos frescos, naturales o congelados en lugar de alimentos enlatados, procesados o empaquetados, que suelen ser ms ricos en azcar, sal y grasas. El 70 al 75% de los alimentos que consuma deben ser verduras y frutas. Realice tres comidas a horas fijas y permita refrigerios entre comidas, pero estos deben ser frutas o verduras. Intente comer durante un perodo de 12 horas, por ejemplo, de 7 a. m. a 7 p. m., y deje de comer despus de su ltima comida del da. Renea schmitz, generalmente alrededor de 64 onzas al da; ninguna otra bebida es tan saludable. La mejor manera de disfrutar del jugo de frutas es comiendo la fruta cytogeneticist.  Gracias por elegir [employer]; consideramos un privilegio atenderle.      For the swelling in your lower extremities, be sure to elevate your legs when able,  mind the salt intake, stay physically active and consider wearing compression stockings.   Around 3 times per week, check your blood pressure 2 times per day. once in the morning and once in the evening. The readings should be at least one minute apart. Write down these values and bring them to your next nurse visit/appointment.  When you check your BP, make sure you have been doing something calm/relaxing 5 minutes prior to checking. Both feet should be flat on the floor and you should be sitting. Use your left arm and make sure it is in a relaxed position (on a table), and that the cuff is at the approximate level/height of your heart.  Please call the office with blood pressure readings consistently greater than 140/90    It is important that you exercise regularly at least 30 minutes 5 times a week as tolerated  Think about what you will eat, plan ahead. Choose  clean, green, fresh or frozen over canned, processed or packaged foods which are more sugary, salty and fatty. 70 to 75% of food eaten should be vegetables and fruit. Three meals at set times with snacks allowed between meals, but they must be fruit or vegetables. Aim to eat over a 12 hour period , example 7 am to 7 pm, and STOP after  your last meal of the day. Drink water,generally about 64 ounces per day, no other drink is as healthy. Fruit juice is best enjoyed in a healthy way, by EATING the fruit.  Thanks for choosing [facility] we consider it a privelige to  serve you.

## 2025-01-02 NOTE — Assessment & Plan Note (Deleted)
 Hyperlipidemia Cholesterol management ongoing with Atorvastatin  20 mg daily. Cholesterol levels need re-evaluation before medication refill. - Ordered fasting lipid panel  - Continue Atorvastatin  20 mg daily until lab results are available. - Encouraged a low-fat diet

## 2025-01-02 NOTE — Assessment & Plan Note (Signed)
 Continue meclizine  25 mg 3 times daily as needed

## 2025-01-02 NOTE — Assessment & Plan Note (Signed)
 DASH diet, elevation, use of compression socks advised

## 2025-01-02 NOTE — Progress Notes (Addendum)
 "  Acute Office Visit  Subjective:     Patient ID: Alexis Sloan, female    DOB: [birth year], 69 y.o.   MRN: [id number]  Chief Complaint  Patient presents with   Foot Swelling    right    HPI   Discussed the use of AI scribe software for clinical note transcription with the patient, who gave verbal consent to proceed.  History of Present Illness Alexis Sloan is a 68 year old female with hypertension and hyperlipidemia who presents for medication refill and evaluation of her blood pressure and cholesterol levels.  She has run out of atorvastatin  20 mg daily and is concerned about her cholesterol levels. She is also taking amlodipine  5 mg daily for blood pressure management. She has not checked her blood pressure at home recently but feels well.  She experiences mild ankle swelling without associated pain and does not use compression socks. She reports occasional shortness of breath and mild dizziness but no chest pain.  She has dry skin and requests an ointment. She also mentions discomfort in her ear without pain, concerned about a possible ear infection.  She takes vitamin B12 daily but is unsure of its necessity. She follows a diet low in sugar, salt, and fatty foods and engages in exercises found on her phone.  Interpretation services provided by medical interpreter    Assessment & Plan        Review of Systems  Constitutional:  Negative for appetite change, chills, fatigue and fever.  HENT:  Negative for congestion, postnasal drip, rhinorrhea and sneezing.   Respiratory:  Negative for cough, shortness of breath and wheezing.   Cardiovascular:  Positive for leg swelling. Negative for chest pain and palpitations.  Gastrointestinal:  Negative for abdominal pain, constipation, nausea and vomiting.  Genitourinary:  Negative for difficulty urinating, dysuria, flank pain and frequency.  Musculoskeletal:  Negative for arthralgias, back pain, joint  swelling and myalgias.  Skin:  Negative for color change, pallor, rash and wound.  Neurological:  Negative for facial asymmetry, weakness, numbness and headaches.  Psychiatric/Behavioral:  Negative for behavioral problems, confusion, self-injury and suicidal ideas.         Objective:    BP 135/74   Pulse 68   Temp (!) 97.3 F (36.3 C)   Wt 159 lb (72.1 kg)   SpO2 100%   BMI 32.67 kg/m    Physical Exam Vitals and nursing note reviewed.  Constitutional:      General: She is not in acute distress.    Appearance: Normal appearance. She is not ill-appearing, toxic-appearing or diaphoretic.  HENT:     Right Ear: Tympanic membrane, ear canal and external ear normal. There is no impacted cerumen.     Left Ear: Tympanic membrane, ear canal and external ear normal. There is no impacted cerumen.     Nose: No congestion or rhinorrhea.     Mouth/Throat:     Mouth: Mucous membranes are moist.     Pharynx: Oropharynx is clear. No oropharyngeal exudate or posterior oropharyngeal erythema.  Eyes:     General: No scleral icterus.       Right eye: No discharge.        Left eye: No discharge.     Extraocular Movements: Extraocular movements intact.     Conjunctiva/sclera: Conjunctivae normal.  Cardiovascular:     Rate and Rhythm: Normal rate and regular rhythm.     Pulses: Normal pulses.     Heart sounds: Normal heart  sounds. No murmur heard.    No friction rub. No gallop.  Pulmonary:     Effort: Pulmonary effort is normal. No respiratory distress.     Breath sounds: Normal breath sounds. No stridor. No wheezing, rhonchi or rales.  Chest:     Chest wall: No tenderness.  Abdominal:     General: There is no distension.     Palpations: Abdomen is soft.     Tenderness: There is no abdominal tenderness. There is no right CVA tenderness, left CVA tenderness or guarding.  Musculoskeletal:        General: No swelling, tenderness, deformity or signs of injury.     Right lower leg: Edema  present.     Left lower leg: No edema.     Comments: Mild edema noted on right ankle  Skin:    General: Skin is warm and dry.     Capillary Refill: Capillary refill takes less than 2 seconds.     Coloration: Skin is not jaundiced or pale.     Findings: No bruising, erythema or lesion.  Neurological:     Mental Status: She is alert and oriented to person, place, and time.     Motor: No weakness.     Gait: Gait normal.  Psychiatric:        Mood and Affect: Mood normal.        Behavior: Behavior normal.        Thought Content: Thought content normal.        Judgment: Judgment normal.     No results found for any visits on 01/02/25.      Assessment & Plan:   Problem List Items Addressed This Visit       Cardiovascular and Mediastinum   Essential hypertension - Primary       01/02/2025    2:14 PM 01/02/2025    2:09 PM 06/24/2024    2:29 PM 06/24/2024    2:16 PM 04/18/2024    1:30 PM 04/18/2024    1:10 PM 01/18/2024    3:17 PM  BP/Weight  Systolic BP 135 139 136 141 115  115  Diastolic BP 74 76 75 74 54  65  Wt. (Lbs)  159  150  149 151  BMI  32.67 kg/m2  30.82 kg/m2  30.61 kg/m2 31.02 kg/m2   Blood pressure controlled at 135/74 mmHg. - Continue Amlodipine  5 mg daily. - Encouraged home blood pressure monitoring twice a week, aiming for readings less than 140/90 mmHg. - Provided educational material on home blood pressure monitoring. - Encouraged a heart-healthy, low salt, low fat diet. - Recommended moderate exercise, at least 150 minutes weekly.       Relevant Medications   amLODipine  (NORVASC ) 5 MG tablet   Other Relevant Orders   Basic Metabolic Panel     Nervous and Auditory   BPPV (benign paroxysmal positional vertigo)   Continue meclizine  25 mg 3 times daily as needed        Musculoskeletal and Integument   Dry skin   Skin is normal on examination Encouraged to keep skin well moisturized by using CeraVe or Cetaphil lotion Avoid hot shower          Other   Prediabetes   Rechecking labs      Relevant Orders   Hemoglobin A1c   Need for influenza vaccination   Relevant Orders   Flu vaccine HIGH DOSE PF(Fluzone Trivalent) (Completed)   Dyslipidemia, goal LDL below 100   Hyperlipidemia Cholesterol  management ongoing with Atorvastatin  20 mg daily. Cholesterol levels need re-evaluation before medication refill. - Ordered fasting lipid panel  - Continue Atorvastatin  20 mg daily until lab results are available. - Encouraged a low-fat diet       Relevant Medications   amLODipine  (NORVASC ) 5 MG tablet   Other Relevant Orders   Lipid panel   Right ankle swelling   DASH diet, elevation, use of compression socks advised      Relevant Orders   Brain natriuretic peptide   Takes dietary supplements   Taking B12 supplement does not know the dosage Checking B12 to ensure she is not getting too much of the supplement      Relevant Orders   Vitamin B12   Ear discomfort, unspecified laterality   Examination of both the ears were normal in the office today       Meds ordered this encounter  Medications   amLODipine  (NORVASC ) 5 MG tablet    Sig: Take 1 tablet (5 mg total) by mouth daily.    Dispense:  90 tablet    Refill:  1    Return in about 4 months (around 05/02/2025) for HTN, FASTING LABS THIS WEEK.  Tayten Bergdoll R Dereck Agerton, FNP  "

## 2025-01-02 NOTE — Assessment & Plan Note (Signed)
" ° °    01/02/2025    2:14 PM 01/02/2025    2:09 PM 06/24/2024    2:29 PM 06/24/2024    2:16 PM 04/18/2024    1:30 PM 04/18/2024    1:10 PM 01/18/2024    3:17 PM  BP/Weight  Systolic BP 135 139 136 141 115  115  Diastolic BP 74 76 75 74 54  65  Wt. (Lbs)  159  150  149 151  BMI  32.67 kg/m2  30.82 kg/m2  30.61 kg/m2 31.02 kg/m2   Blood pressure controlled at 135/74 mmHg. - Continue Amlodipine  5 mg daily. - Encouraged home blood pressure monitoring twice a week, aiming for readings less than 140/90 mmHg. - Provided educational material on home blood pressure monitoring. - Encouraged a heart-healthy, low salt, low fat diet. - Recommended moderate exercise, at least 150 minutes weekly.  "

## 2025-01-02 NOTE — Assessment & Plan Note (Signed)
 Hyperlipidemia Cholesterol management ongoing with Atorvastatin  20 mg daily. Cholesterol levels need re-evaluation before medication refill. - Ordered fasting lipid panel  - Continue Atorvastatin  20 mg daily until lab results are available. - Encouraged a low-fat diet

## 2025-01-02 NOTE — Assessment & Plan Note (Signed)
"  Rechecking labs  "

## 2025-01-02 NOTE — Assessment & Plan Note (Signed)
 Skin is normal on examination Encouraged to keep skin well moisturized by using CeraVe or Cetaphil lotion Avoid hot shower

## 2025-01-02 NOTE — Assessment & Plan Note (Signed)
 Examination of both the ears were normal in the office today

## 2025-01-03 ENCOUNTER — Other Ambulatory Visit: Payer: Self-pay

## 2025-01-03 DIAGNOSIS — E785 Hyperlipidemia, unspecified: Secondary | ICD-10-CM

## 2025-01-03 DIAGNOSIS — R7303 Prediabetes: Secondary | ICD-10-CM

## 2025-01-03 DIAGNOSIS — Z789 Other specified health status: Secondary | ICD-10-CM

## 2025-01-03 DIAGNOSIS — M25471 Effusion, right ankle: Secondary | ICD-10-CM

## 2025-01-03 DIAGNOSIS — I10 Essential (primary) hypertension: Secondary | ICD-10-CM

## 2025-01-05 LAB — LIPID PANEL
Chol/HDL Ratio: 3 ratio (ref 0.0–4.4)
Cholesterol, Total: 190 mg/dL (ref 100–199)
HDL: 63 mg/dL
LDL Chol Calc (NIH): 97 mg/dL (ref 0–99)
Triglycerides: 178 mg/dL — ABNORMAL HIGH (ref 0–149)
VLDL Cholesterol Cal: 30 mg/dL (ref 5–40)

## 2025-01-05 LAB — BASIC METABOLIC PANEL WITH GFR
BUN/Creatinine Ratio: 16 (ref 12–28)
BUN: 12 mg/dL (ref 8–27)
CO2: 22 mmol/L (ref 20–29)
Calcium: 9.4 mg/dL (ref 8.7–10.3)
Chloride: 105 mmol/L (ref 96–106)
Creatinine, Ser: 0.77 mg/dL (ref 0.57–1.00)
Glucose: 99 mg/dL (ref 70–99)
Potassium: 4.2 mmol/L (ref 3.5–5.2)
Sodium: 143 mmol/L (ref 134–144)
eGFR: 84 mL/min/1.73

## 2025-01-05 LAB — HEMOGLOBIN A1C
Est. average glucose Bld gHb Est-mCnc: 131 mg/dL
Hgb A1c MFr Bld: 6.2 % — ABNORMAL HIGH (ref 4.8–5.6)

## 2025-01-05 LAB — BRAIN NATRIURETIC PEPTIDE: BNP: 33 pg/mL (ref 0.0–100.0)

## 2025-01-05 LAB — VITAMIN B12: Vitamin B-12: 1823 pg/mL — ABNORMAL HIGH (ref 232–1245)

## 2025-01-06 ENCOUNTER — Ambulatory Visit: Payer: Self-pay | Admitting: Nurse Practitioner

## 2025-01-08 ENCOUNTER — Other Ambulatory Visit: Payer: Self-pay | Admitting: Nurse Practitioner

## 2025-01-08 DIAGNOSIS — E785 Hyperlipidemia, unspecified: Secondary | ICD-10-CM

## 2025-01-09 NOTE — Telephone Encounter (Signed)
 atorvastatin  (LIPITOR) 20 MG tablet [Pharmacy Med Name: ATORVASTATIN  20MG  TABLETS]

## 2025-01-31 ENCOUNTER — Telehealth: Payer: Self-pay

## 2025-05-02 ENCOUNTER — Ambulatory Visit: Payer: Self-pay | Admitting: Nurse Practitioner
